# Patient Record
Sex: Female | Born: 1970
Health system: Southern US, Community
[De-identification: ages and names within clinical notes are randomized; demographics above are authoritative.]

## PROBLEM LIST (undated history)

## (undated) DIAGNOSIS — Z808 Family history of malignant neoplasm of other organs or systems: Secondary | ICD-10-CM

## (undated) DIAGNOSIS — Z800 Family history of malignant neoplasm of digestive organs: Secondary | ICD-10-CM

## (undated) DIAGNOSIS — R002 Palpitations: Secondary | ICD-10-CM

## (undated) DIAGNOSIS — E039 Hypothyroidism, unspecified: Secondary | ICD-10-CM

## (undated) DIAGNOSIS — Z789 Other specified health status: Secondary | ICD-10-CM

## (undated) DIAGNOSIS — D6959 Other secondary thrombocytopenia: Secondary | ICD-10-CM

## (undated) DIAGNOSIS — R87619 Unspecified abnormal cytological findings in specimens from cervix uteri: Secondary | ICD-10-CM

## (undated) DIAGNOSIS — IMO0002 Reserved for concepts with insufficient information to code with codable children: Secondary | ICD-10-CM

## (undated) DIAGNOSIS — Z803 Family history of malignant neoplasm of breast: Secondary | ICD-10-CM

## (undated) HISTORY — DX: Family history of malignant neoplasm of other organs or systems: Z80.8

## (undated) HISTORY — DX: Family history of malignant neoplasm of digestive organs: Z80.0

## (undated) HISTORY — PX: NO PAST SURGERIES: SHX2092

## (undated) HISTORY — DX: Hypothyroidism, unspecified: E03.9

## (undated) HISTORY — DX: Family history of malignant neoplasm of breast: Z80.3

## (undated) HISTORY — DX: Palpitations: R00.2

---

## 1997-07-14 ENCOUNTER — Other Ambulatory Visit: Admission: RE | Admit: 1997-07-14 | Discharge: 1997-07-14 | Payer: Self-pay | Admitting: Gynecology

## 1998-07-30 ENCOUNTER — Other Ambulatory Visit: Admission: RE | Admit: 1998-07-30 | Discharge: 1998-07-30 | Payer: Self-pay | Admitting: Obstetrics and Gynecology

## 1999-09-23 ENCOUNTER — Other Ambulatory Visit: Admission: RE | Admit: 1999-09-23 | Discharge: 1999-09-23 | Payer: Self-pay | Admitting: Obstetrics and Gynecology

## 2000-10-11 ENCOUNTER — Other Ambulatory Visit: Admission: RE | Admit: 2000-10-11 | Discharge: 2000-10-11 | Payer: Self-pay | Admitting: Gynecology

## 2002-04-04 ENCOUNTER — Other Ambulatory Visit: Admission: RE | Admit: 2002-04-04 | Discharge: 2002-04-04 | Payer: Self-pay | Admitting: *Deleted

## 2002-11-09 ENCOUNTER — Inpatient Hospital Stay (HOSPITAL_COMMUNITY): Admission: AD | Admit: 2002-11-09 | Discharge: 2002-11-12 | Payer: Self-pay | Admitting: *Deleted

## 2002-11-13 ENCOUNTER — Encounter: Admission: RE | Admit: 2002-11-13 | Discharge: 2002-12-13 | Payer: Self-pay | Admitting: *Deleted

## 2002-12-09 ENCOUNTER — Other Ambulatory Visit: Admission: RE | Admit: 2002-12-09 | Discharge: 2002-12-09 | Payer: Self-pay | Admitting: *Deleted

## 2003-01-13 ENCOUNTER — Encounter: Admission: RE | Admit: 2003-01-13 | Discharge: 2003-02-12 | Payer: Self-pay | Admitting: *Deleted

## 2004-02-19 ENCOUNTER — Inpatient Hospital Stay (HOSPITAL_COMMUNITY): Admission: AD | Admit: 2004-02-19 | Discharge: 2004-02-20 | Payer: Self-pay | Admitting: *Deleted

## 2004-02-19 ENCOUNTER — Inpatient Hospital Stay (HOSPITAL_COMMUNITY): Admission: AD | Admit: 2004-02-19 | Discharge: 2004-02-19 | Payer: Self-pay | Admitting: *Deleted

## 2004-02-21 ENCOUNTER — Encounter: Admission: RE | Admit: 2004-02-21 | Discharge: 2004-02-21 | Payer: Self-pay | Admitting: *Deleted

## 2004-03-23 ENCOUNTER — Encounter: Admission: RE | Admit: 2004-03-23 | Discharge: 2004-04-22 | Payer: Self-pay | Admitting: *Deleted

## 2004-04-23 ENCOUNTER — Encounter: Admission: RE | Admit: 2004-04-23 | Discharge: 2004-05-23 | Payer: Self-pay | Admitting: *Deleted

## 2005-04-07 ENCOUNTER — Encounter: Admission: RE | Admit: 2005-04-07 | Discharge: 2005-04-07 | Payer: Self-pay | Admitting: Cardiology

## 2006-04-18 ENCOUNTER — Encounter: Admission: RE | Admit: 2006-04-18 | Discharge: 2006-04-18 | Payer: Self-pay | Admitting: *Deleted

## 2006-05-04 ENCOUNTER — Encounter: Admission: RE | Admit: 2006-05-04 | Discharge: 2006-05-04 | Payer: Self-pay | Admitting: *Deleted

## 2006-05-16 ENCOUNTER — Encounter: Admission: RE | Admit: 2006-05-16 | Discharge: 2006-05-16 | Payer: Self-pay | Admitting: *Deleted

## 2006-06-01 ENCOUNTER — Encounter: Admission: RE | Admit: 2006-06-01 | Discharge: 2006-06-01 | Payer: Self-pay | Admitting: *Deleted

## 2006-06-01 ENCOUNTER — Encounter (INDEPENDENT_AMBULATORY_CARE_PROVIDER_SITE_OTHER): Payer: Self-pay | Admitting: Specialist

## 2007-01-07 ENCOUNTER — Encounter: Admission: RE | Admit: 2007-01-07 | Discharge: 2007-01-07 | Payer: Self-pay | Admitting: *Deleted

## 2007-05-08 ENCOUNTER — Encounter: Admission: RE | Admit: 2007-05-08 | Discharge: 2007-05-08 | Payer: Self-pay | Admitting: *Deleted

## 2008-05-12 ENCOUNTER — Encounter: Admission: RE | Admit: 2008-05-12 | Discharge: 2008-05-12 | Payer: Self-pay | Admitting: Obstetrics and Gynecology

## 2009-04-30 ENCOUNTER — Encounter: Admission: RE | Admit: 2009-04-30 | Discharge: 2009-04-30 | Payer: Self-pay | Admitting: Obstetrics and Gynecology

## 2010-02-27 NOTE — L&D Delivery Note (Signed)
Operative Delivery Note At 5:24 AM a viable and healthy female was delivered via .  Presentation: vertex; Position: Right,, Occiput,, Anterior; Station: +4. Fetal bradycardia x 10 minutes. Decision to proceed with operative delivery.  Verbal consent: obtained from family.  Risks and benefits discussed in detail.  Risks include, but are not limited to the risks of anesthesia, bleeding, infection, damage to maternal tissues, fetal cephalhematoma.  There is also the risk of inability to effect vaginal delivery of the head, or shoulder dystocia that cannot be resolved by established maneuvers, leading to the need for emergency cesarean section.   APGAR: , ; weight .   Placenta status: Intact, Spontaneous.   Cord:  with the following complications: .  Cord pH: pending  Anesthesia:  epidural Instruments: Kiwi cup x one pull Episiotomy: none Lacerations: 1st degree Suture Repair: 3.0 vicryl rapide Est. Blood Loss (mL):   Mom to postpartum.  Baby to nursery-stable.  Auston Halfmann J 01/13/2011, 5:37 AM

## 2010-03-30 ENCOUNTER — Other Ambulatory Visit: Payer: Self-pay | Admitting: Obstetrics and Gynecology

## 2010-03-30 DIAGNOSIS — Z1239 Encounter for other screening for malignant neoplasm of breast: Secondary | ICD-10-CM

## 2010-05-03 ENCOUNTER — Ambulatory Visit
Admission: RE | Admit: 2010-05-03 | Discharge: 2010-05-03 | Disposition: A | Discharge status: Home / Self Care | Payer: BLUE CROSS/BLUE SHIELD | Source: Ambulatory Visit | Attending: Obstetrics and Gynecology | Admitting: Obstetrics and Gynecology

## 2010-05-03 DIAGNOSIS — Z1239 Encounter for other screening for malignant neoplasm of breast: Secondary | ICD-10-CM

## 2010-06-16 LAB — HIV ANTIBODY (ROUTINE TESTING W REFLEX): HIV: NONREACTIVE

## 2010-06-16 LAB — HEPATITIS B SURFACE ANTIGEN: Hepatitis B Surface Ag: NEGATIVE

## 2010-06-16 LAB — RUBELLA ANTIBODY, IGM: Rubella: IMMUNE

## 2010-06-28 ENCOUNTER — Other Ambulatory Visit: Payer: Self-pay | Admitting: Obstetrics and Gynecology

## 2010-07-15 NOTE — H&P (Signed)
   Zuniga, Erica                            ACCOUNT NO.:  [id number]   MEDICAL RECORD NO.:  [id number]                   PATIENT TYPE:  MAT   LOCATION:  [facility]C                                 [facility]   PHYSICIAN:  [place] B. Earlene Plater, M.D.               DATE OF BIRTH:  08-Jul-1970   DATE OF ADMISSION:  DATE OF DISCHARGE:                                HISTORY & PHYSICAL   ADMISSION DIAGNOSES:  Post dates pregnancy.   HISTORY OF PRESENT ILLNESS:  A 40 year old female gravida 1, para 0, EDC of  November 04, 2002, admitted for induction of labor. Prenatal care [facility], Dr. Earlene Plater, uncomplicated. Rh negative. Did not receive RhoGAM, as  the father of the baby is noted to be Rh negative.   PAST MEDICAL HISTORY:  Migraines.   FAMILY HISTORY:  Diabetes.   PAST SURGICAL HISTORY:  Oral surgery.   REVIEW OF SYSTEMS:  Otherwise noncontributory.   MEDICATIONS:  Prenatal vitamins.   ALLERGIES:  None.   SOCIAL HISTORY:  No alcohol or tobacco or other drugs.   PRENATAL LABORATORY DATA:  O negative. Rubella immune. Hepatitis B, HIV, RPR  are all normal. Glucola normal. Group B strep negative. Triple screen  normal.   PHYSICAL EXAMINATION:  VITAL SIGNS: Blood pressure 120/70, weight 151  pounds. Fetal heart tones 150's.  GENERAL: Alert, oriented, and in no acute distress.  SKIN: Warm and dry. No lesions.  HEART: Regular rate and rhythm.  LUNGS: Clear to auscultation.  ABDOMEN: Liver and spleen normal. No hernia. Fundal height 36 cm and  estimated fetal weight on November 05, 2002 was 6 pounds 14 ounces (12th  percentile with normal AFI).  PELVIC: Cervix is 1 cm dilated, 90% effaced, 0 station, vertex.    ASSESSMENT:  A 38+ week intrauterine pregnancy, admitted for induction of  labor with small for gestational age fetus.   PLAN:  Admission with cervical ripening and Pitocin.                                               Gerri Spore B. Earlene Plater, M.D.    WBD/MEDQ  D:   11/09/2002  T:  11/09/2002  Job:  [id number]

## 2010-07-15 NOTE — Op Note (Signed)
[name], Erica Zuniga                          ACCOUNT NO.:  [id number]   MEDICAL RECORD NO.:  [id number]                   PATIENT TYPE:  INP   LOCATION:  9168                                 [facility]   PHYSICIAN:  [place] B. Earlene Plater, M.D.               DATE OF BIRTH:  Dec 21, 1970   DATE OF PROCEDURE:  11/10/2002  DATE OF DISCHARGE:                                 OPERATIVE REPORT   PREOPERATIVE DIAGNOSES:  1. Forty-plus week intrauterine pregnancy.  2. Severe repetitive variables.   POSTOPERATIVE DIAGNOSES:  1. Forty-plus week intrauterine pregnancy.  2. Severe repetitive variables.   PROCEDURE:  Vacuum-assisted vaginal delivery.   SURGEON:  Chester Holstein. Earlene Plater, M.D.   ANESTHESIA:  Epidural and 10 mL 1% lidocaine local in the perineum.   INDICATIONS:  The patient was being induced for post-dates pregnancy  initially with Cervidil and subsequently with Pitocin.  Made rapid progress  from 3 to complete over the span of about three to four hours.  Had been  pushing for about 45 minutes and was complete, complete, +3, direct OA  position, was noted to have severe repetitive variables with a delayed  return to baseline.  Given the nonreassuring fetal heart rate tracing, I  recommended intervention with operative patch delivery.  Risks discussed,  including minor clotting and bruising on the scalp as a primary concern and  rare more severe complications such as intracranial bleeding or  cephalohematoma.  Also discussed the potential for increased perineal trauma  for the mother.  All questions answered, patient in agreement and wished to  proceed.   DESCRIPTION OF PROCEDURE:  The patient was in the delivery room under  epidural anesthesia.  She did have some slight sensation to pinprick, and  therefore I infiltrated the perineum with 10 mL 1% lidocaine.  The bladder  had just been emptied with a red rubber catheter prior to vacuum placement.  The Kiwi device was placed on the  flexion point in the midline just anterior  to the posterior fontanelle.  Approximately three pulls were necessary.  There was one pop-off encountered.  Traction was in the mid-green zone  during pushing efforts only.  NICU was present for delivery.  The infant's  head was delivered was delivered without difficulty, the nose and mouth  suctioned with the bulb, and the remainder of the infant delivered without  difficulty.  There was no evidence of nuchal or body cord.  It was a viable  female, Apgars were 8 and 9.  No injuries noted to the baby.  The placenta  was expelled spontaneously.  It was noted to have a succenturiate lobe,  which had been noted on previous ultrasound.  A second degree perineal  laceration closed with standard technique with 2-0 and 3-0 chromic.  The patient tolerated the procedure well.  There were no complications.  She  and the newborn were in  the delivery room in stable and satisfactory  condition.     WBD/MEDQ  D:  11/10/2002  T:  11/11/2002  Job:  [id number]

## 2011-01-13 ENCOUNTER — Inpatient Hospital Stay (HOSPITAL_COMMUNITY)
Admission: AD | Admit: 2011-01-13 | Discharge: 2011-01-14 | DRG: 373 | Disposition: A | Discharge status: Home / Self Care | Payer: BC Managed Care – PPO | Source: Ambulatory Visit | Attending: Obstetrics and Gynecology | Admitting: Obstetrics and Gynecology

## 2011-01-13 ENCOUNTER — Encounter (HOSPITAL_COMMUNITY): Payer: Self-pay | Admitting: Anesthesiology

## 2011-01-13 ENCOUNTER — Inpatient Hospital Stay (HOSPITAL_COMMUNITY): Payer: BC Managed Care – PPO | Admitting: Anesthesiology

## 2011-01-13 ENCOUNTER — Encounter (HOSPITAL_COMMUNITY): Payer: Self-pay | Admitting: Obstetrics

## 2011-01-13 ENCOUNTER — Encounter (HOSPITAL_COMMUNITY): Payer: Self-pay | Admitting: *Deleted

## 2011-01-13 DIAGNOSIS — O09529 Supervision of elderly multigravida, unspecified trimester: Secondary | ICD-10-CM | POA: Diagnosis present

## 2011-01-13 DIAGNOSIS — D689 Coagulation defect, unspecified: Secondary | ICD-10-CM | POA: Diagnosis present

## 2011-01-13 DIAGNOSIS — D6959 Other secondary thrombocytopenia: Secondary | ICD-10-CM | POA: Diagnosis not present

## 2011-01-13 DIAGNOSIS — D696 Thrombocytopenia, unspecified: Secondary | ICD-10-CM | POA: Diagnosis present

## 2011-01-13 HISTORY — DX: Other specified health status: Z78.9

## 2011-01-13 HISTORY — DX: Unspecified abnormal cytological findings in specimens from cervix uteri: R87.619

## 2011-01-13 HISTORY — DX: Reserved for concepts with insufficient information to code with codable children: IMO0002

## 2011-01-13 HISTORY — DX: Other secondary thrombocytopenia: D69.59

## 2011-01-13 LAB — CBC
MCV: 91.6 fL (ref 78.0–100.0)
Platelets: 154 10*3/uL (ref 150–400)
RDW: 13.4 % (ref 11.5–15.5)
WBC: 9.2 10*3/uL (ref 4.0–10.5)

## 2011-01-13 MED ORDER — LIDOCAINE HCL 1.5 % IJ SOLN
INTRAMUSCULAR | Status: DC | PRN
  Administered 2011-01-13 (×2): 5 mL via EPIDURAL

## 2011-01-13 MED ORDER — FENTANYL 2.5 MCG/ML BUPIVACAINE 1/10 % EPIDURAL INFUSION (WH - ANES)
14.0000 mL/h | INTRAMUSCULAR | Status: DC
  Filled 2011-01-13: qty 60

## 2011-01-13 MED ORDER — DIPHENHYDRAMINE HCL 25 MG PO CAPS: 25.0000 mg | ORAL_CAPSULE | Freq: Four times a day (QID) | ORAL | Status: DC | PRN

## 2011-01-13 MED ORDER — METHYLERGONOVINE MALEATE 0.2 MG PO TABS: 0.2000 mg | ORAL_TABLET | ORAL | Status: DC | PRN

## 2011-01-13 MED ORDER — LIDOCAINE HCL (PF) 1 % IJ SOLN
30.0000 mL | INTRAMUSCULAR | Status: DC | PRN
  Administered 2011-01-13: 30 mL via SUBCUTANEOUS
  Filled 2011-01-13: qty 30

## 2011-01-13 MED ORDER — BENZOCAINE-MENTHOL 20-0.5 % EX AERO
1.0000 "application " | INHALATION_SPRAY | CUTANEOUS | Status: DC | PRN
  Administered 2011-01-14: 1 via TOPICAL

## 2011-01-13 MED ORDER — EPHEDRINE 5 MG/ML INJ
10.0000 mg | INTRAVENOUS | Status: DC | PRN
  Filled 2011-01-13: qty 4

## 2011-01-13 MED ORDER — IBUPROFEN 600 MG PO TABS
600.0000 mg | ORAL_TABLET | Freq: Four times a day (QID) | ORAL | Status: DC
  Administered 2011-01-13 – 2011-01-14 (×4): 600 mg via ORAL
  Filled 2011-01-13 (×3): qty 1

## 2011-01-13 MED ORDER — IBUPROFEN 600 MG PO TABS: 600.0000 mg | ORAL_TABLET | Freq: Four times a day (QID) | ORAL | Status: DC | PRN

## 2011-01-13 MED ORDER — OXYCODONE-ACETAMINOPHEN 5-325 MG PO TABS
1.0000 | ORAL_TABLET | ORAL | Status: DC | PRN
  Administered 2011-01-13 (×2): 1 via ORAL
  Filled 2011-01-13 (×2): qty 1

## 2011-01-13 MED ORDER — EPHEDRINE 5 MG/ML INJ: 10.0000 mg | INTRAVENOUS | Status: DC | PRN

## 2011-01-13 MED ORDER — ONDANSETRON HCL 4 MG PO TABS: 4.0000 mg | ORAL_TABLET | ORAL | Status: DC | PRN

## 2011-01-13 MED ORDER — FLEET ENEMA 7-19 GM/118ML RE ENEM: 1.0000 | ENEMA | RECTAL | Status: DC | PRN

## 2011-01-13 MED ORDER — LACTATED RINGERS IV SOLN
INTRAVENOUS | Status: DC
Start: 2011-01-13 — End: 2011-01-13
  Administered 2011-01-13: 03:00:00 via INTRAVENOUS

## 2011-01-13 MED ORDER — PRENATAL PLUS 27-1 MG PO TABS
1.0000 | ORAL_TABLET | Freq: Every day | ORAL | Status: DC
  Administered 2011-01-14: 1 via ORAL
  Filled 2011-01-13: qty 1

## 2011-01-13 MED ORDER — METHYLERGONOVINE MALEATE 0.2 MG/ML IJ SOLN: 0.2000 mg | INTRAMUSCULAR | Status: DC | PRN

## 2011-01-13 MED ORDER — OXYCODONE-ACETAMINOPHEN 5-325 MG PO TABS: 2.0000 | ORAL_TABLET | ORAL | Status: DC | PRN

## 2011-01-13 MED ORDER — DIPHENHYDRAMINE HCL 50 MG/ML IJ SOLN: 12.5000 mg | INTRAMUSCULAR | Status: DC | PRN

## 2011-01-13 MED ORDER — CITRIC ACID-SODIUM CITRATE 334-500 MG/5ML PO SOLN: 30.0000 mL | ORAL | Status: DC | PRN

## 2011-01-13 MED ORDER — LACTATED RINGERS IV SOLN
500.0000 mL | Freq: Once | INTRAVENOUS | Status: AC
  Administered 2011-01-13: 1000 mL via INTRAVENOUS

## 2011-01-13 MED ORDER — BENZOCAINE-MENTHOL 20-0.5 % EX AERO
INHALATION_SPRAY | CUTANEOUS | Status: AC
  Administered 2011-01-14: 1 via TOPICAL
  Filled 2011-01-13: qty 56

## 2011-01-13 MED ORDER — DIBUCAINE 1 % RE OINT: 1.0000 "application " | TOPICAL_OINTMENT | RECTAL | Status: DC | PRN

## 2011-01-13 MED ORDER — FENTANYL 2.5 MCG/ML BUPIVACAINE 1/10 % EPIDURAL INFUSION (WH - ANES)
INTRAMUSCULAR | Status: DC | PRN
  Administered 2011-01-13: 14 mL/h via EPIDURAL

## 2011-01-13 MED ORDER — ACETAMINOPHEN 325 MG PO TABS: 650.0000 mg | ORAL_TABLET | ORAL | Status: DC | PRN

## 2011-01-13 MED ORDER — PHENYLEPHRINE 40 MCG/ML (10ML) SYRINGE FOR IV PUSH (FOR BLOOD PRESSURE SUPPORT)
80.0000 ug | PREFILLED_SYRINGE | INTRAVENOUS | Status: DC | PRN
  Filled 2011-01-13: qty 5

## 2011-01-13 MED ORDER — ONDANSETRON HCL 4 MG/2ML IJ SOLN: 4.0000 mg | Freq: Four times a day (QID) | INTRAMUSCULAR | Status: DC | PRN

## 2011-01-13 MED ORDER — TETANUS-DIPHTH-ACELL PERTUSSIS 5-2.5-18.5 LF-MCG/0.5 IM SUSP
0.5000 mL | Freq: Once | INTRAMUSCULAR | Status: AC
  Administered 2011-01-14: 0.5 mL via INTRAMUSCULAR
  Filled 2011-01-13: qty 0.5

## 2011-01-13 MED ORDER — LANOLIN HYDROUS EX OINT: TOPICAL_OINTMENT | CUTANEOUS | Status: DC | PRN

## 2011-01-13 MED ORDER — SIMETHICONE 80 MG PO CHEW: 80.0000 mg | CHEWABLE_TABLET | ORAL | Status: DC | PRN

## 2011-01-13 MED ORDER — OXYTOCIN BOLUS FROM INFUSION
500.0000 mL | Freq: Once | INTRAVENOUS | Status: DC
  Filled 2011-01-13: qty 1000
  Filled 2011-01-13: qty 500

## 2011-01-13 MED ORDER — LACTATED RINGERS IV SOLN: 500.0000 mL | INTRAVENOUS | Status: DC | PRN

## 2011-01-13 MED ORDER — ONDANSETRON HCL 4 MG/2ML IJ SOLN: 4.0000 mg | INTRAMUSCULAR | Status: DC | PRN

## 2011-01-13 MED ORDER — OXYTOCIN 20 UNITS IN LACTATED RINGERS INFUSION - SIMPLE
125.0000 mL/h | Freq: Once | INTRAVENOUS | Status: AC
  Administered 2011-01-13: 999 mL/h via INTRAVENOUS

## 2011-01-13 MED ORDER — SENNOSIDES-DOCUSATE SODIUM 8.6-50 MG PO TABS
2.0000 | ORAL_TABLET | Freq: Every day | ORAL | Status: DC
  Administered 2011-01-13: 2 via ORAL

## 2011-01-13 MED ORDER — OXYTOCIN 20 UNITS IN LACTATED RINGERS INFUSION - SIMPLE: 125.0000 mL/h | INTRAVENOUS | Status: DC

## 2011-01-13 MED ORDER — ZOLPIDEM TARTRATE 5 MG PO TABS: 5.0000 mg | ORAL_TABLET | Freq: Every evening | ORAL | Status: DC | PRN

## 2011-01-13 MED ORDER — PHENYLEPHRINE 40 MCG/ML (10ML) SYRINGE FOR IV PUSH (FOR BLOOD PRESSURE SUPPORT): 80.0000 ug | PREFILLED_SYRINGE | INTRAVENOUS | Status: DC | PRN

## 2011-01-13 MED ORDER — WITCH HAZEL-GLYCERIN EX PADS: 1.0000 "application " | MEDICATED_PAD | CUTANEOUS | Status: DC | PRN

## 2011-01-13 NOTE — Anesthesia Procedure Notes (Signed)
Epidural Patient location during procedure: OB Start time: 01/13/2011 4:55 AM End time: 01/13/2011 5:01 AM Reason for block: procedure for pain  Staffing Anesthesiologist: Sandrea Hughs Performed by: anesthesiologist   Preanesthetic Checklist Completed: patient identified, site marked, surgical consent, pre-op evaluation, timeout performed, IV checked, risks and benefits discussed and monitors and equipment checked  Epidural Patient position: sitting Prep: site prepped and draped and DuraPrep Patient monitoring: continuous pulse ox and blood pressure Approach: midline Injection technique: LOR air  Needle:  Needle type: Tuohy  Needle gauge: 17 G Needle length: 9 cm Needle insertion depth: 6 cm Catheter type: closed end flexible Catheter size: 19 Gauge Catheter at skin depth: 11 cm Test dose: negative and 1.5% lidocaine  Assessment Sensory level: T10 Events: blood not aspirated, injection not painful, no injection resistance, negative IV test and paresthesia  Additional Notes R x 1

## 2011-01-13 NOTE — Addendum Note (Signed)
Addendum  created 01/13/11 1641 by Seairra Otani   Modules edited:Charges VN, Notes Section    

## 2011-01-13 NOTE — Anesthesia Preprocedure Evaluation (Signed)

## 2011-01-13 NOTE — Anesthesia Postprocedure Evaluation (Signed)
Anesthesia Post Note  Patient: Erica Zuniga  Procedure(s) Performed: * No procedures listed *  Anesthesia type: Epidural  Patient location: Mother/Baby  Post pain: Pain level controlled  Post assessment: Post-op Vital signs reviewed  Last Vitals:  Filed Vitals:   01/13/11 0700  BP: 106/59  Pulse: 68  Temp:   Resp: 18    Post vital signs: Reviewed  Level of consciousness: awake  Complications: No apparent anesthesia complications

## 2011-01-13 NOTE — Consult Note (Addendum)
Neonatology Note:  Attendance at Delivery:  I was asked to attend this vacuum-assisted vaginal delivery at 38 1/[redacted] weeks GA due to prolonged fetal bradycardia following placement of epidural anesthesia. The mother is a G3P2 O neg, GBS neg. ROM just before delivery, fluid clear. Infant with good spontaneous cry but poor tone at birth, responded well to minimal stimulation. Our team arrived at about 2-3 minutes of age, at which time the baby was crying well, very pink in room air with excellent perfusion, an O2 saturation of 83-85% (normal for age), but no muscle tone. Needed only minimal bulb suctioning. I observed the baby until 10 minutes of life, by which time her tone was improving, with occasional active movements, although not completely normalized yet. Ap 5/7/8. Lungs clear to ausc in DR with a suggestion of grunting noted. I spoke with her parents and felt she could remain in the birthing suite with observation by the OB nurse, with instructions to take her to the CN if any resp distress was noted. Transferred to care of Dr. O'Kelley. Cord pH 6.93 with a base deficit of 19.5. Erica Toomey, MD  

## 2011-01-13 NOTE — Progress Notes (Signed)
Pt G3 P2 at 38wks having contractions every .  Denies bleeding or leaking fluid.  No problems with pregnancy.  11/14 SVE 5cm.

## 2011-01-13 NOTE — Addendum Note (Signed)
Addendum  created 01/13/11 1641 by Cephus Shelling   Modules edited:Charges VN, Notes Section

## 2011-01-13 NOTE — H&P (Signed)
Erica Zuniga, Erica Zuniga                ACCOUNT NO.:  [id number]  MEDICAL RECORD NO.:  [id number]  LOCATION:  9168                          [facility]ILITY:  [facility]  PHYSICIAN:  Lenoard Aden, M.D.DATE OF BIRTH:  [birth year]  DATE OF ADMISSION:  01/13/2011 DATE OF DISCHARGE:                             HISTORY & PHYSICAL   CHIEF COMPLAINT:  Labor.  HISTORY OF PRESENT ILLNESS:  She is a 40 year old white female, G3, P2, at 38-1/7th weeks' gestation who presents in active labor.  She has no known drug allergies.  MEDICATIONS:  Prenatal vitamins.  She is a nonsmoker, nondrinker.  Denies domestic or physical violence.  She has a family history of breast cancer, diabetes, and skin cancer.  She has a history of 2 previous vaginal deliveries.  PAST SURGICAL HISTORY:  Cryotherapy.  PHYSICAL EXAMINATION:  GENERAL:  She is a well-developed, well- nourished, white female, in moderate amount of distress. HEENT:  Normal. NECK:  Supple.  Full range of motion. LUNGS:  Clear. HEART:  Regular rate and rhythm. ABDOMEN:  Soft, gravid, nontender.  Estimated fetal weight 6-1/2 to 7 pounds.  Cervix is 10 cm, 100% vertex, +3. EXTREMITIES:  No cords. NEUROLOGIC:  Nonfocal. SKIN:  Intact.  IMPRESSION:  Term intrauterine pregnancy in active labor.  PLAN:  Anticipate attempts at vaginal delivery.     Lenoard Aden, M.D.     RJT/MEDQ  D:  01/13/2011  T:  01/13/2011  Job:  [id number]

## 2011-01-13 NOTE — Anesthesia Postprocedure Evaluation (Signed)
Anesthesia Post Note  Patient: Erica Zuniga  Procedure(s) Performed: * No procedures listed *  Anesthesia type: Epidural  Patient location: Mother/Baby  Post pain: Pain level controlled  Post assessment: Post-op Vital signs reviewed  Last Vitals:  Filed Vitals:   01/13/11 1503  BP: 101/64  Pulse: 76  Temp: 37 C  Resp: 17    Post vital signs: Reviewed  Level of consciousness:alert  Complications: No apparent anesthesia complications

## 2011-01-14 ENCOUNTER — Encounter (HOSPITAL_COMMUNITY): Payer: Self-pay

## 2011-01-14 DIAGNOSIS — D6959 Other secondary thrombocytopenia: Secondary | ICD-10-CM

## 2011-01-14 HISTORY — DX: Other secondary thrombocytopenia: D69.59

## 2011-01-14 LAB — CBC
HCT: 32.5 % — ABNORMAL LOW (ref 36.0–46.0)
Hemoglobin: 10.9 g/dL — ABNORMAL LOW (ref 12.0–15.0)
MCV: 93.9 fL (ref 78.0–100.0)
RBC: 3.46 MIL/uL — ABNORMAL LOW (ref 3.87–5.11)
RDW: 13.7 % (ref 11.5–15.5)
WBC: 8.7 10*3/uL (ref 4.0–10.5)

## 2011-01-14 MED ORDER — IBUPROFEN 600 MG PO TABS: 600.0000 mg | ORAL_TABLET | Freq: Four times a day (QID) | ORAL | Status: AC | PRN

## 2011-01-14 MED ORDER — BENZOCAINE-MENTHOL 20-0.5 % EX AERO
INHALATION_SPRAY | CUTANEOUS | Status: AC
  Filled 2011-01-14: qty 56

## 2011-01-14 NOTE — Progress Notes (Signed)
PPD 1 VAVD  S:  Reports feeling well, desires early dc this am             Tolerating po/ No nausea or vomiting             Bleeding is light             Pain controlled withibuprofen (OTC)             Up ad lib / ambulatory  Newborn  Information for the patient's newborn:  Shanikka, Wonders [[id number]]  female   breast feeding well   O:  A & O x 3, NAD             VS: Blood pressure 109/73, pulse 76, temperature 98.2 F (36.8 C), temperature source Oral, resp. rate 18, height 5\' 4"  (1.626 m), weight 64.32 kg (141 lb 12.8 oz), last menstrual period 04/16/2010, SpO2 100.00%, unknown if currently breastfeeding.  LABS:   Basename 01/14/11 0600 01/13/11 0300  HGB 10.9* 11.7*  HCT 32.5* 34.0*    I&O:        Lungs: Clear and unlabored  Heart: regular rate and rhythm / no mumurs  Abdomen: soft, non-tender, non-distended              Fundus: firm, non-tender, U-1  Perineum: healing  Lochia: scant rubra  Extremities: no edema    A/P: PPD # 1 40 y.o., G3P1003 S/P:vacuum extraction   Active Problems:  Thrombocytopenia due to blood loss Rh negative status-fetal blood type pending RhoGham PRN  Doing well - stable status Routine post partum orders DC home   Juanetta Beets, SNM Arlan Organ, CNM, MSN 01/14/2011, 9:35 AM

## 2011-01-14 NOTE — Discharge Summary (Signed)
Obstetric Discharge Summary Reason for Admission: onset of labor Prenatal Procedures: ultrasound Intrapartum Procedures: vacuum (fetal intolerance) Postpartum Procedures: Tdap Complications-Operative and Postpartum: 1st degree perineal laceration Hemoglobin  Date Value Range Status  01/14/2011 10.9* 12.0-15.0 (g/dL) Final     HCT  Date Value Range Status  01/14/2011 32.5* 36.0-46.0 (%) Final    Discharge Diagnoses: Term Pregnancy-delivered; thrombocytopenia Rh negative status- newborn with Rh negative blood type.  Discharge Information: Date: 01/14/2011 Activity: pelvic rest Diet: routine Medications: PNV, Ibuprofen and tylenol Condition: stable Instructions: refer to practice specific booklet Discharge to: home Follow-up Information    Follow up with Lenoard Aden, MD. Make an appointment in 6 weeks.   Contact information:   [address] [place] [address] [phone]          Newborn Data: Live born female  Birth Weight: 6 lb 5 oz (2863 g) APGAR: 5, 7  Home with mother.  Erica Zuniga, SNM Eliasar Hlavaty 01/14/2011, 9:55 AM

## 2013-03-31 ENCOUNTER — Other Ambulatory Visit: Payer: Self-pay

## 2013-03-31 DIAGNOSIS — Z1231 Encounter for screening mammogram for malignant neoplasm of breast: Secondary | ICD-10-CM

## 2013-04-21 ENCOUNTER — Ambulatory Visit: Payer: BC Managed Care – PPO

## 2013-05-06 ENCOUNTER — Ambulatory Visit
Admission: RE | Admit: 2013-05-06 | Discharge: 2013-05-06 | Disposition: A | Discharge status: Home / Self Care | Payer: BC Managed Care – PPO | Source: Ambulatory Visit

## 2013-05-06 DIAGNOSIS — Z1231 Encounter for screening mammogram for malignant neoplasm of breast: Secondary | ICD-10-CM

## 2013-12-29 ENCOUNTER — Encounter (HOSPITAL_COMMUNITY): Payer: Self-pay

## 2014-06-16 ENCOUNTER — Other Ambulatory Visit: Payer: Self-pay

## 2014-06-16 DIAGNOSIS — Z1231 Encounter for screening mammogram for malignant neoplasm of breast: Secondary | ICD-10-CM

## 2014-06-18 ENCOUNTER — Ambulatory Visit: Payer: Self-pay

## 2015-06-25 ENCOUNTER — Other Ambulatory Visit: Payer: Self-pay | Admitting: Obstetrics and Gynecology

## 2015-06-25 DIAGNOSIS — R928 Other abnormal and inconclusive findings on diagnostic imaging of breast: Secondary | ICD-10-CM

## 2015-06-30 ENCOUNTER — Ambulatory Visit
Admission: RE | Admit: 2015-06-30 | Discharge: 2015-06-30 | Disposition: A | Discharge status: Home / Self Care | Payer: BLUE CROSS/BLUE SHIELD | Source: Ambulatory Visit | Attending: Obstetrics and Gynecology | Admitting: Obstetrics and Gynecology

## 2015-06-30 DIAGNOSIS — N6489 Other specified disorders of breast: Secondary | ICD-10-CM | POA: Diagnosis not present

## 2015-06-30 DIAGNOSIS — R928 Other abnormal and inconclusive findings on diagnostic imaging of breast: Secondary | ICD-10-CM

## 2015-07-19 ENCOUNTER — Telehealth: Payer: Self-pay | Admitting: Genetic Counselor

## 2015-07-19 ENCOUNTER — Encounter: Payer: Self-pay | Admitting: Genetic Counselor

## 2015-07-19 NOTE — Telephone Encounter (Signed)
Verified address and insurance, mailed new pt packet, pt confirmed appt.

## 2015-07-20 ENCOUNTER — Other Ambulatory Visit: Payer: Self-pay | Admitting: General Surgery

## 2015-07-20 DIAGNOSIS — Z1239 Encounter for other screening for malignant neoplasm of breast: Secondary | ICD-10-CM | POA: Diagnosis not present

## 2015-07-21 ENCOUNTER — Other Ambulatory Visit: Payer: Self-pay | Admitting: General Surgery

## 2015-07-21 DIAGNOSIS — Z803 Family history of malignant neoplasm of breast: Secondary | ICD-10-CM

## 2015-08-20 ENCOUNTER — Ambulatory Visit
Admission: RE | Admit: 2015-08-20 | Discharge: 2015-08-20 | Disposition: A | Discharge status: Home / Self Care | Payer: BLUE CROSS/BLUE SHIELD | Source: Ambulatory Visit | Attending: General Surgery | Admitting: General Surgery

## 2015-08-20 DIAGNOSIS — N6489 Other specified disorders of breast: Secondary | ICD-10-CM | POA: Diagnosis not present

## 2015-08-20 DIAGNOSIS — Z803 Family history of malignant neoplasm of breast: Secondary | ICD-10-CM

## 2015-08-20 MED ORDER — GADOBENATE DIMEGLUMINE 529 MG/ML IV SOLN
11.0000 mL | Freq: Once | INTRAVENOUS | Status: AC | PRN
  Administered 2015-08-20: 11 mL via INTRAVENOUS

## 2015-08-30 ENCOUNTER — Other Ambulatory Visit: Payer: BLUE CROSS/BLUE SHIELD

## 2015-08-30 ENCOUNTER — Ambulatory Visit (HOSPITAL_BASED_OUTPATIENT_CLINIC_OR_DEPARTMENT_OTHER): Payer: BLUE CROSS/BLUE SHIELD | Admitting: Genetic Counselor

## 2015-08-30 ENCOUNTER — Encounter: Payer: Self-pay | Admitting: Genetic Counselor

## 2015-08-30 DIAGNOSIS — Z803 Family history of malignant neoplasm of breast: Secondary | ICD-10-CM | POA: Diagnosis not present

## 2015-08-30 DIAGNOSIS — Z808 Family history of malignant neoplasm of other organs or systems: Secondary | ICD-10-CM | POA: Diagnosis not present

## 2015-08-30 DIAGNOSIS — Z800 Family history of malignant neoplasm of digestive organs: Secondary | ICD-10-CM | POA: Diagnosis not present

## 2015-08-30 DIAGNOSIS — Z315 Encounter for genetic counseling: Secondary | ICD-10-CM | POA: Diagnosis not present

## 2015-08-30 NOTE — Progress Notes (Signed)
REFERRING PROVIDER: Rolm Bookbinder, MD  PRIMARY PROVIDER:  No primary care provider on file.  PRIMARY REASON FOR VISIT:  1. Family history of breast cancer   2. Family history of brain cancer   3. Family history of stomach cancer      HISTORY OF PRESENT ILLNESS:   [name], a 45 y.o. female, was seen for a [facility] cancer genetics consultation at the request of Dr. Donne Hazel due to a family history of cancer.  Erica Zuniga presents to clinic today to discuss the possibility of a hereditary predisposition to cancer, genetic testing, and to further clarify her future cancer risks, as well as potential cancer risks for family members. Erica Zuniga is a 45 y.o. female with no personal history of cancer.  Erica Zuniga was tested for BRCA mutation through Dr. Marvel Plan and was found to have a BRCA2 VUS.  This was found through [facility] in 2015.  Per [facility], this variant is classified as benign or likely benign through other laboratories.  Erica Zuniga had a mammogram that found a mass that is thought to be benign.  She was seen by Dr. Rolm Bookbinder, who referred her to genetics.  CANCER HISTORY:   No history exists.     HORMONAL RISK FACTORS:  Menarche was at age 38.  First live birth at age 46.  OCP use for approximately 20 years.  Ovaries intact: yes.  Hysterectomy: no.  Menopausal status: premenopausal.  HRT use: 0 years. Colonoscopy: no; not examined. Mammogram within the last year: yes. Number of breast biopsies: 1. Up to date with pelvic exams:  yes. Any excessive radiation exposure in the past:  no  Past Medical History  Diagnosis Date  . No pertinent past medical history   . Abnormal Pap smear 45 yo  . Postpartum care following vaginal delivery (01/13/11) 01/13/2011  . Thrombocytopenia due to blood loss 01/14/2011  . Vacuum extraction, delivered, current hospitalization (11/16) 01/13/2011  . Family history of breast cancer   . Family history of brain cancer   . Family  history of stomach cancer     Past Surgical History  Procedure Laterality Date  . No past surgeries      Social History   Social History  . Marital Status: Married    Spouse Name: Herbie Baltimore  . Number of Children: 3  . Years of Education: N/A   Social History Main Topics  . Smoking status: Never Smoker   . Smokeless tobacco: None  . Alcohol Use: Yes     Comment: occasional  . Drug Use: No  . Sexual Activity: Yes   Other Topics Concern  . None   Social History Narrative     FAMILY HISTORY:  We obtained a detailed, 4-generation family history.  Significant diagnoses are listed below: Family History  Problem Relation Age of Onset  . Breast cancer Mother 67  . Brain cancer Father 46  . Stomach cancer Maternal Uncle 25  . Cancer Paternal Uncle 3    tumors in his heart  . Breast cancer Maternal Grandmother 60  . Breast cancer Brother 62  . Breast cancer Maternal Aunt 60  . Breast cancer Maternal Aunt 70    The patient has three daughters who are healthy and cancer free.  She has two maternal half siblings, a sister and brother, and a full brother.  All are cancer free.  Her mother was diagnosed with breast cancer at age 58 and died at 29.  Her mother had four  sisters and three brothers.  Three sisters were diagnosed with breast cancer, one at age 68.  One brother died of stomach cancer at age 55.  The patient's maternal grandmother was diagnosed with breast cancer at age 32 and died at 53.  The patient's father was diagnosed with a brain tumor at 59 and died at 65.  He had two brothers and two sisters.  One brother had tumors in his heart and died by age 47.  Patient's maternal ancestors are of [place] descent, and paternal ancestors are of Caucasian descent. There is no reported Ashkenazi Jewish ancestry. There is no known consanguinity.  GENETIC COUNSELING ASSESSMENT: Erica Zuniga is a 45 y.o. female with a family history of breast, stomach and brain cancer which is somewhat  suggestive of a hereditary cancer syndrome and predisposition to cancer. We, therefore, discussed and recommended the following at today's visit.   DISCUSSION: We discussed that about 5-10% of breast cancer is due to hereditary mutations, most commonly BRCA mutations.  Based on Ms. Waggoner previous testing, she most likely will not have a BRCA mutation.  HOwever, other genes are associated with hereditary breast cancer, most commonly in our clinic we see PALB2, ATM and CHEK2 mutations.  Brain tumors can be associated with hereditary mutations as well.  Some early onset brain tumors are seen with TP53 mutations, but these are usually associated with young breast cancers, with about 50% of patients with TP53 mutations having cancer by age 58-50. We briefly discussed other genes that could be associated with brain and heart tumors, including TSC1 and TSC2, and less likely PARKAR1A mutations. We reviewed the characteristics, features and inheritance patterns of hereditary cancer syndromes. We also discussed genetic testing, including the appropriate family members to test, the process of testing, insurance coverage and turn-around-time for results. We discussed the implications of a negative, positive and/or variant of uncertain significant result. We recommended Ms. Bratton pursue genetic testing for the Hereditary common gene panel and the hereditary brain tumor panel through [facility].  The Hereditary Gene Panel offered by [facility] includes sequencing and/or deletion duplication testing of the following 42 genes: APC, ATM, AXIN2, BARD1, BMPR1A, BRCA1, BRCA2, BRIP1, CDH1, CDKN2A, CHEK2, DICER1, EPCAM, GREM1, KIT, MEN1, MLH1, MSH2, MSH6, MUTYH, NBN, NF1, PALB2, PDGFRA, PMS2, POLD1, POLE, PTEN, RAD50, RAD51C, RAD51D, SDHA, SDHB, SDHC, SDHD, SMAD4, SMARCA4. STK11, TP53, TSC1, TSC2, and VHL.  The CNS/Brain Cancer Panel offered by [facility] includes sequencing and/or deletion duplication testing of the following 42 genes: ALK,  APC, BAP1, BARD1, CDK4, CDKN2A, DICER1, EPCAM, EZH2, GPC3, HRAS, KIF1B, MEN1, MLH1, MSH2, MSH6, NF1, NF2, PHOX2B, PMS2, PO1, PRKAR1A, PTCH2, PTEN, RB1, SMARCA4, SMARCB1, SMARCE1, SUFU, TP53, TSC1, TSC2, and VHL.     Based on Ms. Hasting's family history of cancer, she meets medical criteria for genetic testing. Despite that she meets criteria, she may still have an out of pocket cost. We discussed that if her out of pocket cost for testing is over $100, the laboratory will call and confirm whether she wants to proceed with testing.  If the out of pocket cost of testing is less than $100 she will be billed by the genetic testing laboratory.   Based on the patient's personal and family history, statistical models (Tyrer Cusik)  and literature data were used to estimate her risk of developing breast cancer. These estimate her lifetime risk of developing breast cancer to be approximately 31.7%. This estimation takes into account Ms. Rigg's previously negative genetic testing results.  The  patient's lifetime breast cancer risk is a preliminary estimate based on available information using one of several models endorsed by the [employer] (ACS). The ACS recommends consideration of breast MRI screening as an adjunct to mammography for patients at high risk (defined as 20% or greater lifetime risk). A more detailed breast cancer risk assessment can be considered, if clinically indicated.   PLAN: After considering the risks, benefits, and limitations, Ms. Bisch  provided informed consent to pursue genetic testing and the blood sample was sent to [facility] for analysis of the hereditary common cancer and brain tumor panels. Results should be available within approximately 2-3 weeks' time, at which point they will be disclosed by telephone to Ms. Mclester, as will any additional recommendations warranted by these results. Ms. Petraglia will receive a summary of her genetic counseling visit and a copy of her  results once available. This information will also be available in Epic. We encouraged Ms. Atlas to remain in contact with cancer genetics annually so that we can continuously update the family history and inform her of any changes in cancer genetics and testing that may be of benefit for her family. Ms. Smyser questions were answered to her satisfaction today. Our contact information was provided should additional questions or concerns arise.  Lastly, we encouraged Ms. Ericsson to remain in contact with cancer genetics annually so that we can continuously update the family history and inform her of any changes in cancer genetics and testing that may be of benefit for this family.   Ms.  Dols questions were answered to her satisfaction today. Our contact information was provided should additional questions or concerns arise. Thank you for the referral and allowing [place] to share in the care of your patient.   Karen P. Florene Glen, [place], [facility] Certified Genetic Counselor [email or web address] phone: [phone]  The patient was seen for a total of 60 minutes in face-to-face genetic counseling.  This patient was discussed with Drs. Magrinat, Lindi Adie and/or Burr Medico who agrees with the above.    _______________________________________________________________________ For Office Staff:  Number of people involved in session: 1 Was an Intern/ student involved with case: no

## 2015-09-21 ENCOUNTER — Encounter: Payer: Self-pay | Admitting: Genetic Counselor

## 2015-09-30 ENCOUNTER — Telehealth: Payer: Self-pay | Admitting: Genetic Counselor

## 2015-09-30 ENCOUNTER — Ambulatory Visit: Payer: Self-pay | Admitting: Genetic Counselor

## 2015-09-30 ENCOUNTER — Encounter: Payer: Self-pay | Admitting: Genetic Counselor

## 2015-09-30 DIAGNOSIS — Z1379 Encounter for other screening for genetic and chromosomal anomalies: Secondary | ICD-10-CM | POA: Insufficient documentation

## 2015-09-30 DIAGNOSIS — Z803 Family history of malignant neoplasm of breast: Secondary | ICD-10-CM

## 2015-09-30 DIAGNOSIS — Z808 Family history of malignant neoplasm of other organs or systems: Secondary | ICD-10-CM

## 2015-09-30 DIAGNOSIS — Z800 Family history of malignant neoplasm of digestive organs: Secondary | ICD-10-CM

## 2015-09-30 NOTE — Telephone Encounter (Signed)
LM on VM that results are back.  Left CB instructions.

## 2015-09-30 NOTE — Telephone Encounter (Signed)
Revealed that her genetic testing was negative on a 42 gene panel.  Discussed that based on her family history her lifetime risk for breast cancer is still high.  Based on a [employer], her lifetime risk for breast cancer based on family history and negative genetic testing is above 24%.  Therefore, MRI would be recommended based on ACS guidelines.  Discussed that the pattern of cancer in the family could indicate that there is a cancer syndrome that she did not inherit, and testing other family members would be helpful.  It could also mean that there is either a familial pattern that places her at increased risk but there is not a single gene implicated.  There could also be a gene that we have not discovered yet that in the future could be identified.  Asked that she keep in contact with genetics.  Patient voiced understanding.  Released results to patient and referring MD Emelia Loron.

## 2015-09-30 NOTE — Progress Notes (Signed)
HPI: Ms. Hammontree was previously seen in the [place] clinic due to a family history of cancer and concerns regarding a hereditary predisposition to cancer. Please refer to our prior cancer genetics clinic note for more information regarding Ms. Heckman's medical, social and family histories, and our assessment and recommendations, at the time. Ms. Throckmorton recent genetic test results were disclosed to her, as were recommendations warranted by these results. These results and recommendations are discussed in more detail below.  FAMILY HISTORY:  We obtained a detailed, 4-generation family history.  Significant diagnoses are listed below: Family History  Problem Relation Age of Onset  . Breast cancer Mother 54  . Brain cancer Father 85  . Breast cancer Maternal Aunt 41  . Stomach cancer Maternal Uncle 25  . Cancer Paternal Uncle 3    tumors in his heart  . Breast cancer Maternal Grandmother 60  . Breast cancer Maternal Aunt 60  . Breast cancer Maternal Aunt 70    The patient has three daughters who are healthy and cancer free.  She has two maternal half siblings, a sister and brother, and a full brother.  All are cancer free.  Her mother was diagnosed with breast cancer at age 14 and died at 66.  Her mother had four sisters and three brothers.  Three sisters were diagnosed with breast cancer, one at age 18.  One brother died of stomach cancer at age 85.  The patient's maternal grandmother was diagnosed with breast cancer at age 86 and died at 74.  The patient's father was diagnosed with a brain tumor at 56 and died at 19.  He had two brothers and two sisters.  One brother had tumors in his heart and died by age 44.  Patient's maternal ancestors are of [place] descent, and paternal ancestors are of Caucasian descent. There is no reported Ashkenazi Jewish ancestry. There is no known consanguinity.  GENETIC TEST RESULTS: At the time of Ms. Gayton's visit, we recommended she pursue genetic  testing of the Hereditary Common Cancer gene panel. The Hereditary Gene Panel offered by [facility] includes sequencing and/or deletion duplication testing of the following 42 genes: APC, ATM, AXIN2, BARD1, BMPR1A, BRCA1, BRCA2, BRIP1, CDH1, CDKN2A, CHEK2, DICER1, EPCAM, GREM1, KIT, MEN1, MLH1, MSH2, MSH6, MUTYH, NBN, NF1, PALB2, PDGFRA, PMS2, POLD1, POLE, PTEN, RAD50, RAD51C, RAD51D, SDHA, SDHB, SDHC, SDHD, SMAD4, SMARCA4. STK11, TP53, TSC1, TSC2, and VHL.    The report date is September 29, 2015.  Genetic testing was normal, and did not reveal a deleterious mutation in these genes. The test report has been scanned into EPIC and is located under the Molecular Pathology section of the Results Review tab.   We discussed with Ms. Osburn that since the current genetic testing is not perfect, it is possible there may be a gene mutation in one of these genes that current testing cannot detect, but that chance is small. We also discussed, that it is possible that another gene that has not yet been discovered, or that we have not yet tested, is responsible for the cancer diagnoses in the family, and it is, therefore, important to remain in touch with cancer genetics in the future so that we can continue to offer Ms. Tieszen the most up to date genetic testing.   CANCER SCREENING RECOMMENDATIONS: Given Ms. Tellado's personal and family histories, we must interpret these negative results with some caution.  Families with features suggestive of hereditary risk for cancer tend to have multiple family  members with cancer, diagnoses in multiple generations and diagnoses before the age of 16. Ms. Gunnels family exhibits some of these features. Thus this result may simply reflect our current inability to detect all mutations within these genes or there may be a different gene that has not yet been discovered or tested.   Based on the Ms. Levett's personal and family history of cancer, as well as her genetic test results, statistical  models (Tyrer Cusik)  and literature data were used to estimate her risk of developing breast cancer. These estimate her lifetime risk of developing breast to be approximately 24.3%.  The patient's lifetime breast cancer risk is a preliminary estimate based on available information using one of several models endorsed by the [place] (ACS). The ACS recommends consideration of breast MRI screening as an adjunct to mammography for patients at high risk (defined as 20% or greater lifetime risk). A more detailed breast cancer risk assessment can be considered, if clinically indicated.   Ms. Hilario has been determined to be at high risk for breast cancer.  Therefore, we recommend that annual screening with mammography and breast MRI begin at age 69, or 10 years prior to the age of breast cancer diagnosis in a relative (whichever is earlier).  We discussed that Ms. Kimberlin should discuss her individual situation with her referring physician and determine a breast cancer screening plan with which they are both comfortable.    RECOMMENDATIONS FOR FAMILY MEMBERS: Women in this family might be at some increased risk of developing cancer, over the general population risk, simply due to the family history of cancer. We recommended women in this family have a yearly mammogram beginning at age 23, or 69 years younger than the earliest onset of cancer, an an annual clinical breast exam, and perform monthly breast self-exams. Women in this family should also have a gynecological exam as recommended by their primary provider. All family members should have a colonoscopy by age 72.  FOLLOW-UP: Lastly, we discussed with Ms. Roads that cancer genetics is a rapidly advancing field and it is possible that new genetic tests will be appropriate for her and/or her family members in the future. We encouraged her to remain in contact with cancer genetics on an annual basis so we can update her personal and family histories  and let her know of advances in cancer genetics that may benefit this family.   Our contact number was provided. Ms. Sabey questions were answered to her satisfaction, and she knows she is welcome to call us at anytime with additional questions or concerns.   Roma Kayser, MS, [facility] Certified Genetic Counselor [email or web address]

## 2016-01-07 DIAGNOSIS — H5213 Myopia, bilateral: Secondary | ICD-10-CM | POA: Diagnosis not present

## 2016-04-11 ENCOUNTER — Encounter (HOSPITAL_COMMUNITY): Payer: Self-pay

## 2016-05-30 DIAGNOSIS — F3281 Premenstrual dysphoric disorder: Secondary | ICD-10-CM | POA: Diagnosis not present

## 2016-06-21 DIAGNOSIS — N943 Premenstrual tension syndrome: Secondary | ICD-10-CM | POA: Diagnosis not present

## 2016-06-30 DIAGNOSIS — N943 Premenstrual tension syndrome: Secondary | ICD-10-CM | POA: Diagnosis not present

## 2016-07-04 DIAGNOSIS — N943 Premenstrual tension syndrome: Secondary | ICD-10-CM | POA: Diagnosis not present

## 2016-07-05 DIAGNOSIS — N943 Premenstrual tension syndrome: Secondary | ICD-10-CM | POA: Diagnosis not present

## 2016-07-10 DIAGNOSIS — Z1231 Encounter for screening mammogram for malignant neoplasm of breast: Secondary | ICD-10-CM | POA: Diagnosis not present

## 2016-07-10 DIAGNOSIS — Z01419 Encounter for gynecological examination (general) (routine) without abnormal findings: Secondary | ICD-10-CM | POA: Diagnosis not present

## 2016-07-10 DIAGNOSIS — Z793 Long term (current) use of hormonal contraceptives: Secondary | ICD-10-CM | POA: Diagnosis not present

## 2016-07-10 DIAGNOSIS — Z6822 Body mass index (BMI) 22.0-22.9, adult: Secondary | ICD-10-CM | POA: Diagnosis not present

## 2016-07-10 DIAGNOSIS — F3281 Premenstrual dysphoric disorder: Secondary | ICD-10-CM | POA: Diagnosis not present

## 2016-07-10 DIAGNOSIS — Z1389 Encounter for screening for other disorder: Secondary | ICD-10-CM | POA: Diagnosis not present

## 2016-07-10 DIAGNOSIS — R319 Hematuria, unspecified: Secondary | ICD-10-CM | POA: Diagnosis not present

## 2016-07-10 DIAGNOSIS — Z130 Encounter for screening for diseases of the blood and blood-forming organs and certain disorders involving the immune mechanism: Secondary | ICD-10-CM | POA: Diagnosis not present

## 2016-07-10 DIAGNOSIS — Z30432 Encounter for removal of intrauterine contraceptive device: Secondary | ICD-10-CM | POA: Diagnosis not present

## 2016-07-11 DIAGNOSIS — N943 Premenstrual tension syndrome: Secondary | ICD-10-CM | POA: Diagnosis not present

## 2016-07-13 DIAGNOSIS — N943 Premenstrual tension syndrome: Secondary | ICD-10-CM | POA: Diagnosis not present

## 2016-07-20 DIAGNOSIS — N943 Premenstrual tension syndrome: Secondary | ICD-10-CM | POA: Diagnosis not present

## 2016-07-31 DIAGNOSIS — N943 Premenstrual tension syndrome: Secondary | ICD-10-CM | POA: Diagnosis not present

## 2016-08-02 DIAGNOSIS — N943 Premenstrual tension syndrome: Secondary | ICD-10-CM | POA: Diagnosis not present

## 2016-08-24 DIAGNOSIS — N943 Premenstrual tension syndrome: Secondary | ICD-10-CM | POA: Diagnosis not present

## 2016-08-28 DIAGNOSIS — N943 Premenstrual tension syndrome: Secondary | ICD-10-CM | POA: Diagnosis not present

## 2016-09-05 DIAGNOSIS — Z136 Encounter for screening for cardiovascular disorders: Secondary | ICD-10-CM | POA: Diagnosis not present

## 2016-09-05 DIAGNOSIS — F439 Reaction to severe stress, unspecified: Secondary | ICD-10-CM | POA: Diagnosis not present

## 2016-09-05 DIAGNOSIS — Z630 Problems in relationship with spouse or partner: Secondary | ICD-10-CM | POA: Diagnosis not present

## 2016-09-05 DIAGNOSIS — R454 Irritability and anger: Secondary | ICD-10-CM | POA: Diagnosis not present

## 2016-09-05 DIAGNOSIS — Z803 Family history of malignant neoplasm of breast: Secondary | ICD-10-CM | POA: Diagnosis not present

## 2016-09-05 DIAGNOSIS — R3121 Asymptomatic microscopic hematuria: Secondary | ICD-10-CM | POA: Diagnosis not present

## 2016-09-07 DIAGNOSIS — F439 Reaction to severe stress, unspecified: Secondary | ICD-10-CM | POA: Diagnosis not present

## 2016-09-14 DIAGNOSIS — F439 Reaction to severe stress, unspecified: Secondary | ICD-10-CM | POA: Diagnosis not present

## 2016-09-20 DIAGNOSIS — F439 Reaction to severe stress, unspecified: Secondary | ICD-10-CM | POA: Diagnosis not present

## 2016-10-03 DIAGNOSIS — F439 Reaction to severe stress, unspecified: Secondary | ICD-10-CM | POA: Diagnosis not present

## 2016-10-03 DIAGNOSIS — Z803 Family history of malignant neoplasm of breast: Secondary | ICD-10-CM | POA: Diagnosis not present

## 2016-10-03 DIAGNOSIS — R454 Irritability and anger: Secondary | ICD-10-CM | POA: Diagnosis not present

## 2016-10-03 DIAGNOSIS — R87610 Atypical squamous cells of undetermined significance on cytologic smear of cervix (ASC-US): Secondary | ICD-10-CM | POA: Diagnosis not present

## 2016-10-18 DIAGNOSIS — F439 Reaction to severe stress, unspecified: Secondary | ICD-10-CM | POA: Diagnosis not present

## 2016-10-25 DIAGNOSIS — F439 Reaction to severe stress, unspecified: Secondary | ICD-10-CM | POA: Diagnosis not present

## 2016-11-01 DIAGNOSIS — F439 Reaction to severe stress, unspecified: Secondary | ICD-10-CM | POA: Diagnosis not present

## 2016-11-07 DIAGNOSIS — F439 Reaction to severe stress, unspecified: Secondary | ICD-10-CM | POA: Diagnosis not present

## 2016-11-22 DIAGNOSIS — F439 Reaction to severe stress, unspecified: Secondary | ICD-10-CM | POA: Diagnosis not present

## 2016-11-22 DIAGNOSIS — F3281 Premenstrual dysphoric disorder: Secondary | ICD-10-CM | POA: Diagnosis not present

## 2016-11-29 DIAGNOSIS — F3281 Premenstrual dysphoric disorder: Secondary | ICD-10-CM | POA: Diagnosis not present

## 2016-11-29 DIAGNOSIS — F439 Reaction to severe stress, unspecified: Secondary | ICD-10-CM | POA: Diagnosis not present

## 2016-12-05 DIAGNOSIS — H5213 Myopia, bilateral: Secondary | ICD-10-CM | POA: Diagnosis not present

## 2016-12-05 DIAGNOSIS — H04123 Dry eye syndrome of bilateral lacrimal glands: Secondary | ICD-10-CM | POA: Diagnosis not present

## 2016-12-05 DIAGNOSIS — H5231 Anisometropia: Secondary | ICD-10-CM | POA: Diagnosis not present

## 2016-12-06 DIAGNOSIS — F3281 Premenstrual dysphoric disorder: Secondary | ICD-10-CM | POA: Diagnosis not present

## 2016-12-06 DIAGNOSIS — F439 Reaction to severe stress, unspecified: Secondary | ICD-10-CM | POA: Diagnosis not present

## 2016-12-20 DIAGNOSIS — F3281 Premenstrual dysphoric disorder: Secondary | ICD-10-CM | POA: Diagnosis not present

## 2016-12-20 DIAGNOSIS — F439 Reaction to severe stress, unspecified: Secondary | ICD-10-CM | POA: Diagnosis not present

## 2016-12-27 DIAGNOSIS — F439 Reaction to severe stress, unspecified: Secondary | ICD-10-CM | POA: Diagnosis not present

## 2016-12-27 DIAGNOSIS — F3281 Premenstrual dysphoric disorder: Secondary | ICD-10-CM | POA: Diagnosis not present

## 2017-01-03 DIAGNOSIS — R5383 Other fatigue: Secondary | ICD-10-CM | POA: Diagnosis not present

## 2017-01-03 DIAGNOSIS — E559 Vitamin D deficiency, unspecified: Secondary | ICD-10-CM | POA: Diagnosis not present

## 2017-01-03 DIAGNOSIS — F419 Anxiety disorder, unspecified: Secondary | ICD-10-CM | POA: Diagnosis not present

## 2017-01-03 DIAGNOSIS — F3281 Premenstrual dysphoric disorder: Secondary | ICD-10-CM | POA: Diagnosis not present

## 2017-01-03 DIAGNOSIS — R454 Irritability and anger: Secondary | ICD-10-CM | POA: Diagnosis not present

## 2017-01-10 DIAGNOSIS — F439 Reaction to severe stress, unspecified: Secondary | ICD-10-CM | POA: Diagnosis not present

## 2017-01-10 DIAGNOSIS — F3281 Premenstrual dysphoric disorder: Secondary | ICD-10-CM | POA: Diagnosis not present

## 2017-01-23 DIAGNOSIS — R319 Hematuria, unspecified: Secondary | ICD-10-CM | POA: Diagnosis not present

## 2017-01-23 DIAGNOSIS — F3281 Premenstrual dysphoric disorder: Secondary | ICD-10-CM | POA: Diagnosis not present

## 2017-01-23 DIAGNOSIS — R454 Irritability and anger: Secondary | ICD-10-CM | POA: Diagnosis not present

## 2017-01-23 DIAGNOSIS — R5383 Other fatigue: Secondary | ICD-10-CM | POA: Diagnosis not present

## 2017-01-23 DIAGNOSIS — F419 Anxiety disorder, unspecified: Secondary | ICD-10-CM | POA: Diagnosis not present

## 2017-01-24 DIAGNOSIS — F439 Reaction to severe stress, unspecified: Secondary | ICD-10-CM | POA: Diagnosis not present

## 2017-01-24 DIAGNOSIS — F3281 Premenstrual dysphoric disorder: Secondary | ICD-10-CM | POA: Diagnosis not present

## 2017-01-31 DIAGNOSIS — F3281 Premenstrual dysphoric disorder: Secondary | ICD-10-CM | POA: Diagnosis not present

## 2017-01-31 DIAGNOSIS — F439 Reaction to severe stress, unspecified: Secondary | ICD-10-CM | POA: Diagnosis not present

## 2017-02-09 DIAGNOSIS — F3281 Premenstrual dysphoric disorder: Secondary | ICD-10-CM | POA: Diagnosis not present

## 2017-02-09 DIAGNOSIS — F439 Reaction to severe stress, unspecified: Secondary | ICD-10-CM | POA: Diagnosis not present

## 2017-02-13 DIAGNOSIS — E039 Hypothyroidism, unspecified: Secondary | ICD-10-CM | POA: Diagnosis not present

## 2017-02-13 DIAGNOSIS — F3281 Premenstrual dysphoric disorder: Secondary | ICD-10-CM | POA: Diagnosis not present

## 2017-02-14 DIAGNOSIS — F3281 Premenstrual dysphoric disorder: Secondary | ICD-10-CM | POA: Diagnosis not present

## 2017-02-14 DIAGNOSIS — F439 Reaction to severe stress, unspecified: Secondary | ICD-10-CM | POA: Diagnosis not present

## 2017-03-07 DIAGNOSIS — F3281 Premenstrual dysphoric disorder: Secondary | ICD-10-CM | POA: Diagnosis not present

## 2017-03-07 DIAGNOSIS — F439 Reaction to severe stress, unspecified: Secondary | ICD-10-CM | POA: Diagnosis not present

## 2017-03-14 DIAGNOSIS — F439 Reaction to severe stress, unspecified: Secondary | ICD-10-CM | POA: Diagnosis not present

## 2017-03-14 DIAGNOSIS — F3281 Premenstrual dysphoric disorder: Secondary | ICD-10-CM | POA: Diagnosis not present

## 2017-03-20 DIAGNOSIS — D2262 Melanocytic nevi of left upper limb, including shoulder: Secondary | ICD-10-CM | POA: Diagnosis not present

## 2017-03-20 DIAGNOSIS — D2261 Melanocytic nevi of right upper limb, including shoulder: Secondary | ICD-10-CM | POA: Diagnosis not present

## 2017-03-20 DIAGNOSIS — D2271 Melanocytic nevi of right lower limb, including hip: Secondary | ICD-10-CM | POA: Diagnosis not present

## 2017-03-20 DIAGNOSIS — D2239 Melanocytic nevi of other parts of face: Secondary | ICD-10-CM | POA: Diagnosis not present

## 2017-03-28 DIAGNOSIS — F3281 Premenstrual dysphoric disorder: Secondary | ICD-10-CM | POA: Diagnosis not present

## 2017-03-28 DIAGNOSIS — F439 Reaction to severe stress, unspecified: Secondary | ICD-10-CM | POA: Diagnosis not present

## 2017-04-11 DIAGNOSIS — F439 Reaction to severe stress, unspecified: Secondary | ICD-10-CM | POA: Diagnosis not present

## 2017-04-11 DIAGNOSIS — F3281 Premenstrual dysphoric disorder: Secondary | ICD-10-CM | POA: Diagnosis not present

## 2017-04-16 DIAGNOSIS — R5383 Other fatigue: Secondary | ICD-10-CM | POA: Diagnosis not present

## 2017-04-16 DIAGNOSIS — F419 Anxiety disorder, unspecified: Secondary | ICD-10-CM | POA: Diagnosis not present

## 2017-04-16 DIAGNOSIS — E559 Vitamin D deficiency, unspecified: Secondary | ICD-10-CM | POA: Diagnosis not present

## 2017-04-16 DIAGNOSIS — F3281 Premenstrual dysphoric disorder: Secondary | ICD-10-CM | POA: Diagnosis not present

## 2017-04-16 DIAGNOSIS — R454 Irritability and anger: Secondary | ICD-10-CM | POA: Diagnosis not present

## 2017-04-19 DIAGNOSIS — Z008 Encounter for other general examination: Secondary | ICD-10-CM | POA: Diagnosis not present

## 2017-04-23 DIAGNOSIS — F419 Anxiety disorder, unspecified: Secondary | ICD-10-CM | POA: Diagnosis not present

## 2017-04-23 DIAGNOSIS — F3281 Premenstrual dysphoric disorder: Secondary | ICD-10-CM | POA: Diagnosis not present

## 2017-04-23 DIAGNOSIS — R5383 Other fatigue: Secondary | ICD-10-CM | POA: Diagnosis not present

## 2017-04-23 DIAGNOSIS — R454 Irritability and anger: Secondary | ICD-10-CM | POA: Diagnosis not present

## 2017-04-23 DIAGNOSIS — E039 Hypothyroidism, unspecified: Secondary | ICD-10-CM | POA: Diagnosis not present

## 2017-04-25 DIAGNOSIS — F439 Reaction to severe stress, unspecified: Secondary | ICD-10-CM | POA: Diagnosis not present

## 2017-04-25 DIAGNOSIS — F3281 Premenstrual dysphoric disorder: Secondary | ICD-10-CM | POA: Diagnosis not present

## 2017-05-02 DIAGNOSIS — F439 Reaction to severe stress, unspecified: Secondary | ICD-10-CM | POA: Diagnosis not present

## 2017-05-02 DIAGNOSIS — F3281 Premenstrual dysphoric disorder: Secondary | ICD-10-CM | POA: Diagnosis not present

## 2017-05-04 DIAGNOSIS — M542 Cervicalgia: Secondary | ICD-10-CM | POA: Diagnosis not present

## 2017-05-04 DIAGNOSIS — M9903 Segmental and somatic dysfunction of lumbar region: Secondary | ICD-10-CM | POA: Diagnosis not present

## 2017-05-04 DIAGNOSIS — M25521 Pain in right elbow: Secondary | ICD-10-CM | POA: Diagnosis not present

## 2017-05-04 DIAGNOSIS — M9901 Segmental and somatic dysfunction of cervical region: Secondary | ICD-10-CM | POA: Diagnosis not present

## 2017-05-09 DIAGNOSIS — F439 Reaction to severe stress, unspecified: Secondary | ICD-10-CM | POA: Diagnosis not present

## 2017-05-09 DIAGNOSIS — F3281 Premenstrual dysphoric disorder: Secondary | ICD-10-CM | POA: Diagnosis not present

## 2017-05-17 DIAGNOSIS — Z803 Family history of malignant neoplasm of breast: Secondary | ICD-10-CM | POA: Diagnosis not present

## 2017-05-17 DIAGNOSIS — Z87448 Personal history of other diseases of urinary system: Secondary | ICD-10-CM | POA: Diagnosis not present

## 2017-05-17 DIAGNOSIS — Z0000 Encounter for general adult medical examination without abnormal findings: Secondary | ICD-10-CM | POA: Diagnosis not present

## 2017-05-17 DIAGNOSIS — R002 Palpitations: Secondary | ICD-10-CM | POA: Diagnosis not present

## 2017-05-18 ENCOUNTER — Telehealth: Payer: Self-pay

## 2017-05-18 NOTE — Telephone Encounter (Signed)
SENT REFERRAL TO SCHEDULING FROM DR DEBORAH SCHOENHOFF PH# [phone]  

## 2017-05-24 DIAGNOSIS — F439 Reaction to severe stress, unspecified: Secondary | ICD-10-CM | POA: Diagnosis not present

## 2017-05-24 DIAGNOSIS — F3281 Premenstrual dysphoric disorder: Secondary | ICD-10-CM | POA: Diagnosis not present

## 2017-05-30 DIAGNOSIS — F439 Reaction to severe stress, unspecified: Secondary | ICD-10-CM | POA: Diagnosis not present

## 2017-05-30 DIAGNOSIS — F3281 Premenstrual dysphoric disorder: Secondary | ICD-10-CM | POA: Diagnosis not present

## 2017-06-06 DIAGNOSIS — Z87448 Personal history of other diseases of urinary system: Secondary | ICD-10-CM | POA: Diagnosis not present

## 2017-06-06 DIAGNOSIS — F439 Reaction to severe stress, unspecified: Secondary | ICD-10-CM | POA: Diagnosis not present

## 2017-06-06 DIAGNOSIS — Z803 Family history of malignant neoplasm of breast: Secondary | ICD-10-CM | POA: Diagnosis not present

## 2017-06-06 DIAGNOSIS — F3281 Premenstrual dysphoric disorder: Secondary | ICD-10-CM | POA: Diagnosis not present

## 2017-06-13 DIAGNOSIS — F439 Reaction to severe stress, unspecified: Secondary | ICD-10-CM | POA: Diagnosis not present

## 2017-06-13 DIAGNOSIS — F3281 Premenstrual dysphoric disorder: Secondary | ICD-10-CM | POA: Diagnosis not present

## 2017-06-20 DIAGNOSIS — F439 Reaction to severe stress, unspecified: Secondary | ICD-10-CM | POA: Diagnosis not present

## 2017-06-20 DIAGNOSIS — F3281 Premenstrual dysphoric disorder: Secondary | ICD-10-CM | POA: Diagnosis not present

## 2017-07-04 DIAGNOSIS — F439 Reaction to severe stress, unspecified: Secondary | ICD-10-CM | POA: Diagnosis not present

## 2017-07-04 DIAGNOSIS — F3281 Premenstrual dysphoric disorder: Secondary | ICD-10-CM | POA: Diagnosis not present

## 2017-07-11 ENCOUNTER — Encounter: Payer: Self-pay | Admitting: Cardiovascular Disease

## 2017-07-11 ENCOUNTER — Telehealth: Payer: Self-pay | Admitting: *Deleted

## 2017-07-11 DIAGNOSIS — F3281 Premenstrual dysphoric disorder: Secondary | ICD-10-CM | POA: Diagnosis not present

## 2017-07-11 DIAGNOSIS — F439 Reaction to severe stress, unspecified: Secondary | ICD-10-CM | POA: Diagnosis not present

## 2017-07-11 NOTE — Telephone Encounter (Signed)
FAXED NOTES TO [facility] FROM DR. [facility] SCHOENHOFF AT [phone].

## 2017-07-13 ENCOUNTER — Encounter: Payer: Self-pay | Admitting: Cardiovascular Disease

## 2017-07-13 ENCOUNTER — Ambulatory Visit (INDEPENDENT_AMBULATORY_CARE_PROVIDER_SITE_OTHER): Payer: BLUE CROSS/BLUE SHIELD | Admitting: Cardiovascular Disease

## 2017-07-13 VITALS — BP 110/71 | HR 76 | Ht 64.0 in | Wt 131.8 lb

## 2017-07-13 DIAGNOSIS — R002 Palpitations: Secondary | ICD-10-CM | POA: Diagnosis not present

## 2017-07-13 DIAGNOSIS — E039 Hypothyroidism, unspecified: Secondary | ICD-10-CM

## 2017-07-13 DIAGNOSIS — Z8639 Personal history of other endocrine, nutritional and metabolic disease: Secondary | ICD-10-CM | POA: Diagnosis not present

## 2017-07-13 DIAGNOSIS — R000 Tachycardia, unspecified: Secondary | ICD-10-CM

## 2017-07-13 DIAGNOSIS — Z5181 Encounter for therapeutic drug level monitoring: Secondary | ICD-10-CM

## 2017-07-13 HISTORY — DX: Hypothyroidism, unspecified: E03.9

## 2017-07-13 HISTORY — DX: Palpitations: R00.2

## 2017-07-13 NOTE — Progress Notes (Signed)
Cardiology Office Note   Date:  07/13/2017   ID:  Zuniga Erica, DOB [birth year], MRN [id number]  PCP:  Kendrick Ranch, MD  Cardiologist:   Chilton Si, MD   Chief Complaint  Patient presents with  . Follow-up  . Chest Pain    pressure, tightness, fluttering.  . Leg Pain    cramping in legs at night.       History of Present Illness: Erica Zuniga is a 47 y.o. female with hypothyroidism who is being seen today for the evaluation of chest tightness and palpitations at the request of Schoenhoff, Harrington Challenger, *.  [name] has had intermittent episodes of palpitations for the last 12 years.  It started after the birth of her second child.  At that time she saw a cardiologist and reports that she was told she had a "arrhythmia" but did not require any treatment.  Over time it had improved.  Starting in January she started to have more frequent episodes of chest tightness and heart racing.  It typically occurs when sitting at her desk.  She does not feel particularly stressed at the time.  She has an ECG monitor on her watch and brings recordings that show sinus tachycardia from the 100s to 110s.  The episodes make her have to take deep breaths but she does not feel particularly short of breath.  There is no associated lightheadedness or dizziness.  Last from 5 to 20 minutes at a time.  In the last 2 days she had 2 episodes.  The last episode prior to that was 2 weeks ago.  Around the same time when the symptoms started her Armour Thyroid was increased from 30-60.  She has not experienced any lower extremity edema, orthopnea, or PND.  She has no exertional symptoms.  She does not get much formal exercise but she does walk her dog daily for 1 mile.  This takes approximately 20 minutes.  She had one episode of shortness of breath that occurred while carrying her 40-year-old on her back but no other exertional chest pain or shortness of breath.  She has no caffeine intake and rarely uses  over-the-counter Sudafed.  She reports that she has not been feeling stressed lately.   Past Medical History:  Diagnosis Date  . Abnormal Pap smear 47 yo  . Family history of brain cancer   . Family history of breast cancer   . Family history of stomach cancer   . Hypothyroidism 07/13/2017  . No pertinent past medical history   . Palpitations 07/13/2017  . Postpartum care following vaginal delivery (01/13/11) 01/13/2011  . Thrombocytopenia due to blood loss 01/14/2011  . Vacuum extraction, delivered, current hospitalization (11/16) 01/13/2011    Past Surgical History:  Procedure Laterality Date  . NO PAST SURGERIES       Current Outpatient Medications  Medication Sig Dispense Refill  . ARMOUR THYROID 60 MG tablet   3  . escitalopram (LEXAPRO) 10 MG tablet escitalopram 10 mg tablet  TAKE ONE TABLET BY MOUTH DAILY    . [name] 3-0.02 MG tablet   4   No current facility-administered medications for this visit.     Allergies:   Patient has no known allergies.    Social History:  The patient  reports that she has never smoked. She has never used smokeless tobacco. She reports that she drinks alcohol. She reports that she does not use drugs.   Family History:  The patient's family  history includes Brain cancer (age of onset: 73) in her father; Breast cancer (age of onset: 19) in her maternal aunt; Breast cancer (age of onset: 3) in her maternal aunt, maternal grandmother, and mother; Breast cancer (age of onset: 49) in her maternal aunt; Cancer (age of onset: 3) in her paternal uncle; Stomach cancer (age of onset: 15) in her maternal uncle.    ROS:  Please see the history of present illness.   Otherwise, review of systems are positive for none.   All other systems are reviewed and negative.    PHYSICAL EXAM: VS:  BP 110/71   Pulse 76   Ht  (1.626 m)   Wt 131 lb 12.8 oz (59.8 kg)   BMI 22.62 kg/m  , BMI Body mass index is 22.62 kg/m. GENERAL:  Well appearing HEENT:   Pupils equal round and reactive, fundi not visualized, oral mucosa unremarkable NECK:  No jugular venous distention, waveform within normal limits, carotid upstroke brisk and symmetric, no bruits, no thyromegaly LYMPHATICS:  No cervical adenopathy LUNGS:  Clear to auscultation bilaterally HEART:  RRR.  PMI not displaced or sustained,S1 and S2 within normal limits, no S3, no S4, no clicks, no rubs, no murmurs ABD:  Flat, positive bowel sounds normal in frequency in pitch, no bruits, no rebound, no guarding, no midline pulsatile mass, no hepatomegaly, no splenomegaly EXT:  2 plus pulses throughout, no edema, no cyanosis no clubbing SKIN:  No rashes no nodules NEURO:  Cranial nerves II through XII grossly intact, motor grossly intact throughout PSYCH:  Cognitively intact, oriented to person place and time   EKG:  EKG is ordered today. The ekg ordered today demonstrates sinus rhythm.  Rate 76 bpm.   Recent Labs: No results found for requested labs within last 8760 hours.   09/05/2016: Total cholesterol 186, triglycerides 176, HDL 65, LDL 86 TSH 1.9 Hemoglobin 16.1 Potassium 3.8, creatinine 0.6  Lipid Panel No results found for: CHOL, TRIG, HDL, CHOLHDL, VLDL, LDLCALC, LDLDIRECT    Wt Readings from Last 3 Encounters:  07/13/17 131 lb 12.8 oz (59.8 kg)  01/13/11 141 lb 12.8 oz (64.3 kg)      ASSESSMENT AND PLAN:  # Palpitations: ECG tracing showed sinus tachycardia from the 100s to the 110s.  These were taken at the time of her symptoms so ambulatory event monitoring is unnecessary at this time.  I suspect that it may be due to the change in her thyroid medication.  We will check TSH, free T4, CBC, CMP, and magnesium today.   Current medicines are reviewed at length with the patient today.  The patient does not have concerns regarding medicines.  The following changes have been made:  no change  Labs/ tests ordered today include:   Orders Placed This Encounter  Procedures  .  Comprehensive metabolic panel  . T4, free  . TSH  . CBC with Differential/Platelet  . Magnesium  . Basic metabolic panel     Disposition:   FU with APP in 1 month.   Zannah Melucci C. Duke Salvia, MD, [facility] n 3 months.     Signed, Addie Cederberg C. Duke Salvia, MD, [facility]  07/13/2017 1:05 PM    [facility]

## 2017-07-13 NOTE — Patient Instructions (Addendum)
Medication Instructions:  Your physician recommends that you continue on your current medications as directed. Please refer to the Current Medication list given to you today.  Labwork: BMET/CBC/TSH/FT4/MAGNESIUM TODAY   Testing/Procedures: NONE  Follow-Up: Your physician recommends that you schedule a follow-up appointment in: 1 MONTH WITH NP/PA  If you need a refill on your cardiac medications before your next appointment, please call your pharmacy.

## 2017-07-14 LAB — BASIC METABOLIC PANEL
BUN/Creatinine Ratio: 22 (ref 9–23)
BUN: 15 mg/dL (ref 6–24)
CALCIUM: 8.8 mg/dL (ref 8.7–10.2)
CHLORIDE: 100 mmol/L (ref 96–106)
CO2: 23 mmol/L (ref 20–29)
CREATININE: 0.69 mg/dL (ref 0.57–1.00)
GFR calc Af Amer: 122 mL/min/{1.73_m2} (ref >59)
GFR calc non Af Amer: 106 mL/min/{1.73_m2} (ref >59)
GLUCOSE: 85 mg/dL (ref 65–99)
Potassium: 4.8 mmol/L (ref 3.5–5.2)
Sodium: 135 mmol/L (ref 134–144)

## 2017-07-14 LAB — CBC WITH DIFFERENTIAL/PLATELET
BASOS ABS: 0 10*3/uL (ref 0.0–0.2)
Basos: 0 %
EOS (ABSOLUTE): 0.1 10*3/uL (ref 0.0–0.4)
EOS: 2 %
HEMATOCRIT: 35.2 % (ref 34.0–46.6)
HEMOGLOBIN: 11.8 g/dL (ref 11.1–15.9)
IMMATURE GRANS (ABS): 0 10*3/uL (ref 0.0–0.1)
Immature Granulocytes: 0 %
LYMPHS ABS: 1.9 10*3/uL (ref 0.7–3.1)
LYMPHS: 41 %
MCH: 32 pg (ref 26.6–33.0)
MCHC: 33.5 g/dL (ref 31.5–35.7)
MCV: 95 fL (ref 79–97)
MONOCYTES: 7 %
Monocytes Absolute: 0.3 10*3/uL (ref 0.1–0.9)
NEUTROS ABS: 2.3 10*3/uL (ref 1.4–7.0)
Neutrophils: 50 %
Platelets: 208 10*3/uL (ref 150–379)
RBC: 3.69 x10E6/uL — ABNORMAL LOW (ref 3.77–5.28)
RDW: 12 % — ABNORMAL LOW (ref 12.3–15.4)
WBC: 4.7 10*3/uL (ref 3.4–10.8)

## 2017-07-14 LAB — TSH: TSH: 0.662 u[IU]/mL (ref 0.450–4.500)

## 2017-07-14 LAB — MAGNESIUM: Magnesium: 1.8 mg/dL (ref 1.6–2.3)

## 2017-07-14 LAB — T4, FREE: Free T4: 0.85 ng/dL (ref 0.82–1.77)

## 2017-07-16 DIAGNOSIS — Z1231 Encounter for screening mammogram for malignant neoplasm of breast: Secondary | ICD-10-CM | POA: Diagnosis not present

## 2017-07-18 DIAGNOSIS — F439 Reaction to severe stress, unspecified: Secondary | ICD-10-CM | POA: Diagnosis not present

## 2017-07-18 DIAGNOSIS — F3281 Premenstrual dysphoric disorder: Secondary | ICD-10-CM | POA: Diagnosis not present

## 2017-07-25 DIAGNOSIS — F3281 Premenstrual dysphoric disorder: Secondary | ICD-10-CM | POA: Diagnosis not present

## 2017-07-25 DIAGNOSIS — F439 Reaction to severe stress, unspecified: Secondary | ICD-10-CM | POA: Diagnosis not present

## 2017-08-01 DIAGNOSIS — F439 Reaction to severe stress, unspecified: Secondary | ICD-10-CM | POA: Diagnosis not present

## 2017-08-01 DIAGNOSIS — F3281 Premenstrual dysphoric disorder: Secondary | ICD-10-CM | POA: Diagnosis not present

## 2017-08-03 DIAGNOSIS — Z1389 Encounter for screening for other disorder: Secondary | ICD-10-CM | POA: Diagnosis not present

## 2017-08-03 DIAGNOSIS — Z130 Encounter for screening for diseases of the blood and blood-forming organs and certain disorders involving the immune mechanism: Secondary | ICD-10-CM | POA: Diagnosis not present

## 2017-08-03 DIAGNOSIS — Z124 Encounter for screening for malignant neoplasm of cervix: Secondary | ICD-10-CM | POA: Diagnosis not present

## 2017-08-03 DIAGNOSIS — Z6822 Body mass index (BMI) 22.0-22.9, adult: Secondary | ICD-10-CM | POA: Diagnosis not present

## 2017-08-03 DIAGNOSIS — Z01419 Encounter for gynecological examination (general) (routine) without abnormal findings: Secondary | ICD-10-CM | POA: Diagnosis not present

## 2017-08-08 DIAGNOSIS — F3281 Premenstrual dysphoric disorder: Secondary | ICD-10-CM | POA: Diagnosis not present

## 2017-08-08 DIAGNOSIS — F439 Reaction to severe stress, unspecified: Secondary | ICD-10-CM | POA: Diagnosis not present

## 2017-08-15 DIAGNOSIS — F439 Reaction to severe stress, unspecified: Secondary | ICD-10-CM | POA: Diagnosis not present

## 2017-08-15 DIAGNOSIS — F3281 Premenstrual dysphoric disorder: Secondary | ICD-10-CM | POA: Diagnosis not present

## 2017-08-17 ENCOUNTER — Encounter: Payer: Self-pay | Admitting: Cardiology

## 2017-08-17 ENCOUNTER — Ambulatory Visit (INDEPENDENT_AMBULATORY_CARE_PROVIDER_SITE_OTHER): Payer: BLUE CROSS/BLUE SHIELD | Admitting: Cardiology

## 2017-08-17 DIAGNOSIS — R002 Palpitations: Secondary | ICD-10-CM

## 2017-08-17 DIAGNOSIS — E039 Hypothyroidism, unspecified: Secondary | ICD-10-CM

## 2017-08-17 NOTE — Assessment & Plan Note (Signed)
By her description it sounds like she was having PSVT

## 2017-08-17 NOTE — Progress Notes (Signed)
08/17/2017 Erica Zuniga   January 08, 1971  [id number]  Primary Physician Constance GoltzSchoenhoff, Harrington Challengereborah D, MD Primary Cardiologist: Dr Duke Salviaandolph  HPI:  Pleasant 47 y/o female just seen by Dr Duke Salviaandolph 4 weeks ago for palpitations. The pt describes intermittent episodes of sudden onset tachycardia. Her HR may go to 120. She has an Apple watch with a HR monitor on it and she has recorded a HR of 120. The tracing is difficult to read but it appears to be an SVT. Dr Duke Salviaandolph ordered labs including a TSH. These were normal Her symptoms have quieted down since her LOV. She thinks she has only had one episode in the past month. These episodes don't last long but make her uncomfortable. She denies any nausea or diaphoresis but admits to chest 'tightness" when she has the tachycardia.    Current Outpatient Medications  Medication Sig Dispense Refill  . ARMOUR THYROID 60 MG tablet   3  . escitalopram (LEXAPRO) 10 MG tablet escitalopram 10 mg tablet  TAKE ONE TABLET BY MOUTH DAILY    . [name] 3-0.02 MG tablet   4   No current facility-administered medications for this visit.     No Known Allergies  Past Medical History:  Diagnosis Date  . Abnormal Pap smear 47 yo  . Family history of brain cancer   . Family history of breast cancer   . Family history of stomach cancer   . Hypothyroidism 07/13/2017  . No pertinent past medical history   . Palpitations 07/13/2017  . Postpartum care following vaginal delivery (01/13/11) 01/13/2011  . Thrombocytopenia due to blood loss 01/14/2011  . Vacuum extraction, delivered, current hospitalization (11/16) 01/13/2011    Social History   Socioeconomic History  . Marital status: Married    Spouse name: Erica Zuniga  . Number of children: 3  . Years of education: Not on file  . Highest education level: Not on file  Occupational History  . Not on file  Social Needs  . Financial resource strain: Not on file  . Food insecurity:    Worry: Not on file    Inability: Not on  file  . Transportation needs:    Medical: Not on file    Non-medical: Not on file  Tobacco Use  . Smoking status: Never Smoker  . Smokeless tobacco: Never Used  Substance and Sexual Activity  . Alcohol use: Yes    Comment: occasional  . Drug use: No  . Sexual activity: Yes  Lifestyle  . Physical activity:    Days per week: Not on file    Minutes per session: Not on file  . Stress: Not on file  Relationships  . Social connections:    Talks on phone: Not on file    Gets together: Not on file    Attends religious service: Not on file    Active member of club or organization: Not on file    Attends meetings of clubs or organizations: Not on file    Relationship status: Not on file  . Intimate partner violence:    Fear of current or ex partner: Not on file    Emotionally abused: Not on file    Physically abused: Not on file    Forced sexual activity: Not on file  Other Topics Concern  . Not on file  Social History Narrative  . Not on file     Family History  Problem Relation Age of Onset  . Breast cancer Mother 560  . Brain  cancer Father 54  . Breast cancer Maternal Aunt 41  . Stomach cancer Maternal Uncle 25  . Cancer Paternal Uncle 3       tumors in his heart  . Breast cancer Maternal Grandmother 60  . Breast cancer Maternal Aunt 60  . Breast cancer Maternal Aunt 70     Review of Systems: General: negative for chills, fever, night sweats or weight changes.  Cardiovascular: negative for chest pain, dyspnea on exertion, edema, orthopnea, palpitations, paroxysmal nocturnal dyspnea or shortness of breath Dermatological: negative for rash Respiratory: negative for cough or wheezing Urologic: negative for hematuria Abdominal: negative for nausea, vomiting, diarrhea, bright red blood per rectum, melena, or hematemesis Neurologic: negative for visual changes, syncope, or dizziness All other systems reviewed and are otherwise negative except as noted above.    Blood  pressure 103/70, pulse 74, height 5\' 4"  (1.626 m), weight 130 lb 12.8 oz (59.3 kg), unknown if currently breastfeeding.  General appearance: alert, cooperative and no distress Extremities: extremities normal, atraumatic, no cyanosis or edema Skin: Skin color, texture, turgor normal. No rashes or lesions Neurologic: Grossly normal   ASSESSMENT AND PLAN:   Palpitations By her description it sounds like she was having PSVT  Hypothyroidism TSH WNL   PLAN  For now I suggested avoiding caffeine and OTC decongestants. I told her I thought she was probably having PSVT. I described vagal maneuver to her to see if she could break the episode on her own. Currently these episodes seem to have abated. I suggested she call if she starts to have more frequent episodes and we can obtain a 30 day event (without making her come to the office first), or if she is in the area she can come in for an EKG. F/U DR Duke Salvia in 6 months.   Corine Shelter PA-C 08/17/2017 8:16 AM

## 2017-08-17 NOTE — Patient Instructions (Signed)
Medication Instructions: Your physician recommends that you continue on your current medications as directed. Please refer to the Current Medication list given to you today.  If you need a refill on your cardiac medications before your next appointment, please call your pharmacy.   Labwork: None  Procedures/Testing: None  Follow-Up: Your physician wants you to follow-up in 6 months with Dr. Fara Chuteandolph You will receive a reminder letter in the mail two months in advance. If you don't receive a letter, please call our office at [phone] to schedule this follow-up appointment.   Special Instructions: Please call the office if you have recurrent palpitations at [phone]. Please avoid decongestants and caffeine.    [facility] you [facility]!!

## 2017-08-17 NOTE — Assessment & Plan Note (Signed)
TSH WNL

## 2017-08-21 ENCOUNTER — Other Ambulatory Visit: Payer: Self-pay | Admitting: Internal Medicine

## 2017-08-21 DIAGNOSIS — R002 Palpitations: Secondary | ICD-10-CM | POA: Diagnosis not present

## 2017-08-21 DIAGNOSIS — Z9189 Other specified personal risk factors, not elsewhere classified: Secondary | ICD-10-CM

## 2017-08-21 DIAGNOSIS — Z87448 Personal history of other diseases of urinary system: Secondary | ICD-10-CM | POA: Diagnosis not present

## 2017-08-29 ENCOUNTER — Ambulatory Visit
Admission: RE | Admit: 2017-08-29 | Discharge: 2017-08-29 | Disposition: A | Discharge status: Home / Self Care | Payer: BLUE CROSS/BLUE SHIELD | Source: Ambulatory Visit | Attending: Internal Medicine | Admitting: Internal Medicine

## 2017-08-29 DIAGNOSIS — F3281 Premenstrual dysphoric disorder: Secondary | ICD-10-CM | POA: Diagnosis not present

## 2017-08-29 DIAGNOSIS — Z9189 Other specified personal risk factors, not elsewhere classified: Secondary | ICD-10-CM

## 2017-08-29 DIAGNOSIS — F439 Reaction to severe stress, unspecified: Secondary | ICD-10-CM | POA: Diagnosis not present

## 2017-08-29 DIAGNOSIS — Z803 Family history of malignant neoplasm of breast: Secondary | ICD-10-CM | POA: Diagnosis not present

## 2017-08-29 MED ORDER — GADOBENATE DIMEGLUMINE 529 MG/ML IV SOLN
11.0000 mL | Freq: Once | INTRAVENOUS | Status: AC | PRN
  Administered 2017-08-29: 11 mL via INTRAVENOUS

## 2017-09-05 DIAGNOSIS — F3281 Premenstrual dysphoric disorder: Secondary | ICD-10-CM | POA: Diagnosis not present

## 2017-09-05 DIAGNOSIS — F439 Reaction to severe stress, unspecified: Secondary | ICD-10-CM | POA: Diagnosis not present

## 2017-09-12 DIAGNOSIS — F439 Reaction to severe stress, unspecified: Secondary | ICD-10-CM | POA: Diagnosis not present

## 2017-09-12 DIAGNOSIS — F3281 Premenstrual dysphoric disorder: Secondary | ICD-10-CM | POA: Diagnosis not present

## 2017-10-09 DIAGNOSIS — F439 Reaction to severe stress, unspecified: Secondary | ICD-10-CM | POA: Diagnosis not present

## 2017-10-09 DIAGNOSIS — F3281 Premenstrual dysphoric disorder: Secondary | ICD-10-CM | POA: Diagnosis not present

## 2017-10-25 DIAGNOSIS — R5383 Other fatigue: Secondary | ICD-10-CM | POA: Diagnosis not present

## 2017-10-25 DIAGNOSIS — E559 Vitamin D deficiency, unspecified: Secondary | ICD-10-CM | POA: Diagnosis not present

## 2017-10-25 DIAGNOSIS — F3281 Premenstrual dysphoric disorder: Secondary | ICD-10-CM | POA: Diagnosis not present

## 2017-10-25 DIAGNOSIS — E039 Hypothyroidism, unspecified: Secondary | ICD-10-CM | POA: Diagnosis not present

## 2017-10-25 DIAGNOSIS — F419 Anxiety disorder, unspecified: Secondary | ICD-10-CM | POA: Diagnosis not present

## 2017-10-31 DIAGNOSIS — F439 Reaction to severe stress, unspecified: Secondary | ICD-10-CM | POA: Diagnosis not present

## 2017-10-31 DIAGNOSIS — F3281 Premenstrual dysphoric disorder: Secondary | ICD-10-CM | POA: Diagnosis not present

## 2017-11-05 DIAGNOSIS — F3281 Premenstrual dysphoric disorder: Secondary | ICD-10-CM | POA: Diagnosis not present

## 2017-11-05 DIAGNOSIS — F439 Reaction to severe stress, unspecified: Secondary | ICD-10-CM | POA: Diagnosis not present

## 2017-11-14 DIAGNOSIS — F3281 Premenstrual dysphoric disorder: Secondary | ICD-10-CM | POA: Diagnosis not present

## 2017-11-14 DIAGNOSIS — F439 Reaction to severe stress, unspecified: Secondary | ICD-10-CM | POA: Diagnosis not present

## 2017-11-19 DIAGNOSIS — F439 Reaction to severe stress, unspecified: Secondary | ICD-10-CM | POA: Diagnosis not present

## 2017-11-19 DIAGNOSIS — F3281 Premenstrual dysphoric disorder: Secondary | ICD-10-CM | POA: Diagnosis not present

## 2017-11-28 DIAGNOSIS — F439 Reaction to severe stress, unspecified: Secondary | ICD-10-CM | POA: Diagnosis not present

## 2017-11-28 DIAGNOSIS — F3281 Premenstrual dysphoric disorder: Secondary | ICD-10-CM | POA: Diagnosis not present

## 2017-12-05 DIAGNOSIS — F439 Reaction to severe stress, unspecified: Secondary | ICD-10-CM | POA: Diagnosis not present

## 2017-12-05 DIAGNOSIS — F3281 Premenstrual dysphoric disorder: Secondary | ICD-10-CM | POA: Diagnosis not present

## 2017-12-07 DIAGNOSIS — H5231 Anisometropia: Secondary | ICD-10-CM | POA: Diagnosis not present

## 2017-12-07 DIAGNOSIS — H04123 Dry eye syndrome of bilateral lacrimal glands: Secondary | ICD-10-CM | POA: Diagnosis not present

## 2017-12-07 DIAGNOSIS — H5213 Myopia, bilateral: Secondary | ICD-10-CM | POA: Diagnosis not present

## 2017-12-12 DIAGNOSIS — F3281 Premenstrual dysphoric disorder: Secondary | ICD-10-CM | POA: Diagnosis not present

## 2017-12-12 DIAGNOSIS — F439 Reaction to severe stress, unspecified: Secondary | ICD-10-CM | POA: Diagnosis not present

## 2017-12-17 DIAGNOSIS — F439 Reaction to severe stress, unspecified: Secondary | ICD-10-CM | POA: Diagnosis not present

## 2017-12-17 DIAGNOSIS — F3281 Premenstrual dysphoric disorder: Secondary | ICD-10-CM | POA: Diagnosis not present

## 2017-12-27 DIAGNOSIS — F3281 Premenstrual dysphoric disorder: Secondary | ICD-10-CM | POA: Diagnosis not present

## 2017-12-27 DIAGNOSIS — F439 Reaction to severe stress, unspecified: Secondary | ICD-10-CM | POA: Diagnosis not present

## 2018-01-03 DIAGNOSIS — F439 Reaction to severe stress, unspecified: Secondary | ICD-10-CM | POA: Diagnosis not present

## 2018-01-03 DIAGNOSIS — F3281 Premenstrual dysphoric disorder: Secondary | ICD-10-CM | POA: Diagnosis not present

## 2018-01-10 DIAGNOSIS — F439 Reaction to severe stress, unspecified: Secondary | ICD-10-CM | POA: Diagnosis not present

## 2018-01-10 DIAGNOSIS — F3281 Premenstrual dysphoric disorder: Secondary | ICD-10-CM | POA: Diagnosis not present

## 2018-01-21 DIAGNOSIS — F3281 Premenstrual dysphoric disorder: Secondary | ICD-10-CM | POA: Diagnosis not present

## 2018-01-21 DIAGNOSIS — F439 Reaction to severe stress, unspecified: Secondary | ICD-10-CM | POA: Diagnosis not present

## 2018-01-30 DIAGNOSIS — F439 Reaction to severe stress, unspecified: Secondary | ICD-10-CM | POA: Diagnosis not present

## 2018-01-30 DIAGNOSIS — F3281 Premenstrual dysphoric disorder: Secondary | ICD-10-CM | POA: Diagnosis not present

## 2018-02-07 DIAGNOSIS — F439 Reaction to severe stress, unspecified: Secondary | ICD-10-CM | POA: Diagnosis not present

## 2018-02-07 DIAGNOSIS — F3281 Premenstrual dysphoric disorder: Secondary | ICD-10-CM | POA: Diagnosis not present

## 2018-02-28 DIAGNOSIS — F3281 Premenstrual dysphoric disorder: Secondary | ICD-10-CM | POA: Diagnosis not present

## 2018-02-28 DIAGNOSIS — F439 Reaction to severe stress, unspecified: Secondary | ICD-10-CM | POA: Diagnosis not present

## 2018-02-28 DIAGNOSIS — L309 Dermatitis, unspecified: Secondary | ICD-10-CM | POA: Diagnosis not present

## 2018-03-21 DIAGNOSIS — F3281 Premenstrual dysphoric disorder: Secondary | ICD-10-CM | POA: Diagnosis not present

## 2018-03-21 DIAGNOSIS — F439 Reaction to severe stress, unspecified: Secondary | ICD-10-CM | POA: Diagnosis not present

## 2018-03-28 DIAGNOSIS — F3281 Premenstrual dysphoric disorder: Secondary | ICD-10-CM | POA: Diagnosis not present

## 2018-03-28 DIAGNOSIS — F439 Reaction to severe stress, unspecified: Secondary | ICD-10-CM | POA: Diagnosis not present

## 2018-04-04 DIAGNOSIS — F3281 Premenstrual dysphoric disorder: Secondary | ICD-10-CM | POA: Diagnosis not present

## 2018-04-04 DIAGNOSIS — F439 Reaction to severe stress, unspecified: Secondary | ICD-10-CM | POA: Diagnosis not present

## 2018-04-11 DIAGNOSIS — F439 Reaction to severe stress, unspecified: Secondary | ICD-10-CM | POA: Diagnosis not present

## 2018-04-11 DIAGNOSIS — F3281 Premenstrual dysphoric disorder: Secondary | ICD-10-CM | POA: Diagnosis not present

## 2018-04-11 DIAGNOSIS — E559 Vitamin D deficiency, unspecified: Secondary | ICD-10-CM | POA: Diagnosis not present

## 2018-04-11 DIAGNOSIS — R454 Irritability and anger: Secondary | ICD-10-CM | POA: Diagnosis not present

## 2018-04-11 DIAGNOSIS — F419 Anxiety disorder, unspecified: Secondary | ICD-10-CM | POA: Diagnosis not present

## 2018-04-11 DIAGNOSIS — R5383 Other fatigue: Secondary | ICD-10-CM | POA: Diagnosis not present

## 2018-04-16 DIAGNOSIS — F3281 Premenstrual dysphoric disorder: Secondary | ICD-10-CM | POA: Diagnosis not present

## 2018-04-16 DIAGNOSIS — F419 Anxiety disorder, unspecified: Secondary | ICD-10-CM | POA: Diagnosis not present

## 2018-04-16 DIAGNOSIS — E039 Hypothyroidism, unspecified: Secondary | ICD-10-CM | POA: Diagnosis not present

## 2018-04-16 DIAGNOSIS — R5383 Other fatigue: Secondary | ICD-10-CM | POA: Diagnosis not present

## 2018-04-18 DIAGNOSIS — F439 Reaction to severe stress, unspecified: Secondary | ICD-10-CM | POA: Diagnosis not present

## 2018-04-18 DIAGNOSIS — F3281 Premenstrual dysphoric disorder: Secondary | ICD-10-CM | POA: Diagnosis not present

## 2018-04-24 DIAGNOSIS — R87610 Atypical squamous cells of undetermined significance on cytologic smear of cervix (ASC-US): Secondary | ICD-10-CM | POA: Diagnosis not present

## 2018-04-24 DIAGNOSIS — Z803 Family history of malignant neoplasm of breast: Secondary | ICD-10-CM | POA: Diagnosis not present

## 2018-04-24 DIAGNOSIS — Z0000 Encounter for general adult medical examination without abnormal findings: Secondary | ICD-10-CM | POA: Diagnosis not present

## 2018-04-24 DIAGNOSIS — R002 Palpitations: Secondary | ICD-10-CM | POA: Diagnosis not present

## 2018-04-25 DIAGNOSIS — F3281 Premenstrual dysphoric disorder: Secondary | ICD-10-CM | POA: Diagnosis not present

## 2018-04-25 DIAGNOSIS — F439 Reaction to severe stress, unspecified: Secondary | ICD-10-CM | POA: Diagnosis not present

## 2018-05-02 DIAGNOSIS — F3281 Premenstrual dysphoric disorder: Secondary | ICD-10-CM | POA: Diagnosis not present

## 2018-05-02 DIAGNOSIS — F439 Reaction to severe stress, unspecified: Secondary | ICD-10-CM | POA: Diagnosis not present

## 2018-05-09 DIAGNOSIS — F3281 Premenstrual dysphoric disorder: Secondary | ICD-10-CM | POA: Diagnosis not present

## 2018-05-09 DIAGNOSIS — F439 Reaction to severe stress, unspecified: Secondary | ICD-10-CM | POA: Diagnosis not present

## 2018-05-31 DIAGNOSIS — F439 Reaction to severe stress, unspecified: Secondary | ICD-10-CM | POA: Diagnosis not present

## 2018-05-31 DIAGNOSIS — F3281 Premenstrual dysphoric disorder: Secondary | ICD-10-CM | POA: Diagnosis not present

## 2018-06-04 ENCOUNTER — Telehealth: Payer: Self-pay | Admitting: *Deleted

## 2018-06-04 NOTE — Telephone Encounter (Signed)
Patient called from recall list, past due for follow up visit with Dr [place] Called to try to arrange virtual visit. Left message to call back   

## 2018-06-10 DIAGNOSIS — F3281 Premenstrual dysphoric disorder: Secondary | ICD-10-CM | POA: Diagnosis not present

## 2018-06-10 DIAGNOSIS — F439 Reaction to severe stress, unspecified: Secondary | ICD-10-CM | POA: Diagnosis not present

## 2018-06-24 DIAGNOSIS — F439 Reaction to severe stress, unspecified: Secondary | ICD-10-CM | POA: Diagnosis not present

## 2018-06-24 DIAGNOSIS — F3281 Premenstrual dysphoric disorder: Secondary | ICD-10-CM | POA: Diagnosis not present

## 2018-07-02 DIAGNOSIS — F3281 Premenstrual dysphoric disorder: Secondary | ICD-10-CM | POA: Diagnosis not present

## 2018-07-02 DIAGNOSIS — F439 Reaction to severe stress, unspecified: Secondary | ICD-10-CM | POA: Diagnosis not present

## 2018-07-09 DIAGNOSIS — F439 Reaction to severe stress, unspecified: Secondary | ICD-10-CM | POA: Diagnosis not present

## 2018-07-09 DIAGNOSIS — F3281 Premenstrual dysphoric disorder: Secondary | ICD-10-CM | POA: Diagnosis not present

## 2018-07-16 DIAGNOSIS — R5383 Other fatigue: Secondary | ICD-10-CM | POA: Diagnosis not present

## 2018-07-16 DIAGNOSIS — F3281 Premenstrual dysphoric disorder: Secondary | ICD-10-CM | POA: Diagnosis not present

## 2018-07-16 DIAGNOSIS — E039 Hypothyroidism, unspecified: Secondary | ICD-10-CM | POA: Diagnosis not present

## 2018-07-16 DIAGNOSIS — E559 Vitamin D deficiency, unspecified: Secondary | ICD-10-CM | POA: Diagnosis not present

## 2018-07-16 DIAGNOSIS — F419 Anxiety disorder, unspecified: Secondary | ICD-10-CM | POA: Diagnosis not present

## 2018-07-18 DIAGNOSIS — F3281 Premenstrual dysphoric disorder: Secondary | ICD-10-CM | POA: Diagnosis not present

## 2018-07-18 DIAGNOSIS — F439 Reaction to severe stress, unspecified: Secondary | ICD-10-CM | POA: Diagnosis not present

## 2018-07-23 DIAGNOSIS — F3281 Premenstrual dysphoric disorder: Secondary | ICD-10-CM | POA: Diagnosis not present

## 2018-07-23 DIAGNOSIS — F439 Reaction to severe stress, unspecified: Secondary | ICD-10-CM | POA: Diagnosis not present

## 2018-07-29 DIAGNOSIS — F3281 Premenstrual dysphoric disorder: Secondary | ICD-10-CM | POA: Diagnosis not present

## 2018-07-29 DIAGNOSIS — F439 Reaction to severe stress, unspecified: Secondary | ICD-10-CM | POA: Diagnosis not present

## 2018-09-12 DIAGNOSIS — F3281 Premenstrual dysphoric disorder: Secondary | ICD-10-CM | POA: Diagnosis not present

## 2018-09-12 DIAGNOSIS — F439 Reaction to severe stress, unspecified: Secondary | ICD-10-CM | POA: Diagnosis not present

## 2018-09-19 DIAGNOSIS — F3281 Premenstrual dysphoric disorder: Secondary | ICD-10-CM | POA: Diagnosis not present

## 2018-09-19 DIAGNOSIS — F439 Reaction to severe stress, unspecified: Secondary | ICD-10-CM | POA: Diagnosis not present

## 2018-09-26 DIAGNOSIS — F3281 Premenstrual dysphoric disorder: Secondary | ICD-10-CM | POA: Diagnosis not present

## 2018-09-26 DIAGNOSIS — F439 Reaction to severe stress, unspecified: Secondary | ICD-10-CM | POA: Diagnosis not present

## 2018-10-01 DIAGNOSIS — F3281 Premenstrual dysphoric disorder: Secondary | ICD-10-CM | POA: Diagnosis not present

## 2018-10-01 DIAGNOSIS — F439 Reaction to severe stress, unspecified: Secondary | ICD-10-CM | POA: Diagnosis not present

## 2018-10-24 DIAGNOSIS — F3281 Premenstrual dysphoric disorder: Secondary | ICD-10-CM | POA: Diagnosis not present

## 2018-10-24 DIAGNOSIS — F439 Reaction to severe stress, unspecified: Secondary | ICD-10-CM | POA: Diagnosis not present

## 2018-10-28 DIAGNOSIS — F439 Reaction to severe stress, unspecified: Secondary | ICD-10-CM | POA: Diagnosis not present

## 2018-10-28 DIAGNOSIS — F3281 Premenstrual dysphoric disorder: Secondary | ICD-10-CM | POA: Diagnosis not present

## 2018-10-31 ENCOUNTER — Ambulatory Visit: Payer: BLUE CROSS/BLUE SHIELD | Admitting: Cardiovascular Disease

## 2018-12-10 DIAGNOSIS — H5213 Myopia, bilateral: Secondary | ICD-10-CM | POA: Diagnosis not present

## 2018-12-10 DIAGNOSIS — H524 Presbyopia: Secondary | ICD-10-CM | POA: Diagnosis not present

## 2018-12-10 DIAGNOSIS — H5231 Anisometropia: Secondary | ICD-10-CM | POA: Diagnosis not present

## 2018-12-10 DIAGNOSIS — H16002 Unspecified corneal ulcer, left eye: Secondary | ICD-10-CM | POA: Diagnosis not present

## 2018-12-10 DIAGNOSIS — H04123 Dry eye syndrome of bilateral lacrimal glands: Secondary | ICD-10-CM | POA: Diagnosis not present

## 2018-12-26 DIAGNOSIS — L0232 Furuncle of buttock: Secondary | ICD-10-CM | POA: Diagnosis not present

## 2018-12-26 DIAGNOSIS — L2084 Intrinsic (allergic) eczema: Secondary | ICD-10-CM | POA: Diagnosis not present

## 2019-01-16 DIAGNOSIS — F3281 Premenstrual dysphoric disorder: Secondary | ICD-10-CM | POA: Diagnosis not present

## 2019-01-16 DIAGNOSIS — E559 Vitamin D deficiency, unspecified: Secondary | ICD-10-CM | POA: Diagnosis not present

## 2019-01-16 DIAGNOSIS — R5383 Other fatigue: Secondary | ICD-10-CM | POA: Diagnosis not present

## 2019-01-16 DIAGNOSIS — F419 Anxiety disorder, unspecified: Secondary | ICD-10-CM | POA: Diagnosis not present

## 2019-02-18 DIAGNOSIS — L2089 Other atopic dermatitis: Secondary | ICD-10-CM | POA: Diagnosis not present

## 2019-06-27 ENCOUNTER — Encounter: Payer: Self-pay | Admitting: Cardiology

## 2019-06-27 ENCOUNTER — Telehealth (INDEPENDENT_AMBULATORY_CARE_PROVIDER_SITE_OTHER): Payer: BC Managed Care – PPO | Admitting: Cardiology

## 2019-06-27 VITALS — Ht 64.0 in | Wt 135.0 lb

## 2019-06-27 DIAGNOSIS — R002 Palpitations: Secondary | ICD-10-CM

## 2019-06-27 DIAGNOSIS — E039 Hypothyroidism, unspecified: Secondary | ICD-10-CM | POA: Diagnosis not present

## 2019-06-27 NOTE — Patient Instructions (Signed)
Medication Instructions:  Your physician recommends that you continue on your current medications as directed. Please refer to the Current Medication list given to you today.  *If you need a refill on your cardiac medications before your next appointment, please call your pharmacy*  Follow-Up: At [facility], you and your health needs are our priority.  As part of our continuing mission to provide you with exceptional heart care, we have created designated Provider Care Teams.  These Care Teams include your primary Cardiologist (physician) and Advanced Practice Providers (APPs -  Physician Assistants and Nurse Practitioners) who all work together to provide you with the care you need, when you need it.  We recommend signing up for the patient portal called "MyChart".  Sign up information is provided on this After Visit Summary.  MyChart is used to connect with patients for Virtual Visits (Telemedicine).  Patients are able to view lab/test results, encounter notes, upcoming appointments, etc.  Non-urgent messages can be sent to your provider as well.   To learn more about what you can do with MyChart, go to [email or web address].    Your next appointment:   As needed with Dr. Duke Salvia

## 2019-06-27 NOTE — Progress Notes (Signed)
Virtual Visit via Telephone Note   This visit type was conducted due to national recommendations for restrictions regarding the COVID-19 Pandemic (e.g. social distancing) in an effort to limit this patient's exposure and mitigate transmission in our community.  Due to her co-morbid illnesses, this patient is at least at moderate risk for complications without adequate follow up.  This format is felt to be most appropriate for this patient at this time.  The patient did not have access to video technology/had technical difficulties with video requiring transitioning to audio format only (telephone).  All issues noted in this document were discussed and addressed.  No physical exam could be performed with this format.  Please refer to the patient's chart for her  consent to telehealth for [facility].   The patient was identified using 2 identifiers.  Date:  06/27/2019   ID:  Erica Zuniga, DOB Oct 05, 1970, MRN [id number]  Patient Location: Home Provider Location: Home  PCP:  Lanice Shirts, MD  Cardiologist:  Dr Oval Linsey Electrophysiologist:  None   Evaluation Performed:  Follow-Up Visit  Chief Complaint:  none  History of Present Illness:    Erica Zuniga is a 49 y.o. female with a history of palpitations. She had seen [place] in the past for intermittent sudden tachycardia and was suspected to have PSVT. She was educated on avoiding stimulants and OTC decongestants and her symptoms improved.  She was contacted today for routine follow up.  She has done well since we saw her last.  She has at home with her child doing home schooling since COVID restrictions were implemented.  She has had her vaccine.   The patient does not have symptoms concerning for COVID-19 infection (fever, chills, cough, or new shortness of breath).    Past Medical History:  Diagnosis Date  . Abnormal Pap smear 49 yo  . Family history of brain cancer   . Family history of breast cancer   . Family history  of stomach cancer   . Hypothyroidism 07/13/2017  . No pertinent past medical history   . Palpitations 07/13/2017  . Postpartum care following vaginal delivery (01/13/11) 01/13/2011  . Thrombocytopenia due to blood loss 01/14/2011  . Vacuum extraction, delivered, current hospitalization (11/16) 01/13/2011   Past Surgical History:  Procedure Laterality Date  . NO PAST SURGERIES       Current Meds  Medication Sig  . DENAVIR 1 % cream as needed.   . dupilumab (DUPIXENT) 300 MG/2ML prefilled syringe Inject into the skin.   Marland Kitchen escitalopram (LEXAPRO) 10 MG tablet Take 10 mg by mouth daily.   . famciclovir (FAMVIR) 500 MG tablet Take 500 mg by mouth as needed.   . fexofenadine (ALLEGRA ALLERGY) 180 MG tablet Take 180 mg by mouth as needed.   Marland Kitchen [name] 3-0.02 MG tablet      Allergies:   Patient has no known allergies.   Social History   Tobacco Use  . Smoking status: Never Smoker  . Smokeless tobacco: Never Used  Substance Use Topics  . Alcohol use: Yes    Comment: occasional  . Drug use: No     Family Hx: The patient's family history includes Brain cancer (age of onset: 73) in her father; Breast cancer (age of onset: 85) in her maternal aunt; Breast cancer (age of onset: 5) in her maternal aunt, maternal grandmother, and mother; Breast cancer (age of onset: 20) in her maternal aunt; Cancer (age of onset: 31) in her paternal uncle; Stomach  cancer (age of onset: 4) in her maternal uncle.  ROS:   Please see the history of present illness.    All other systems reviewed and are negative.   Prior CV studies:   The following studies were reviewed today:   Labs/Other Tests and Data Reviewed:    EKG:  An ECG dated 07/13/2017 was personally reviewed today and demonstrated:  NSR-HR 76  Recent Labs: No results found for requested labs within last 8760 hours.   Recent Lipid Panel No results found for: CHOL, TRIG, HDL, CHOLHDL, LDLCALC, LDLDIRECT  Wt Readings from Last 3 Encounters:    06/27/19 135 lb (61.2 kg)  08/17/17 130 lb 12.8 oz (59.3 kg)  07/13/17 131 lb 12.8 oz (59.8 kg)     Objective:    Vital Signs:  Ht 5\' 4"  (1.626 m)   Wt 135 lb (61.2 kg)   BMI 23.17 kg/m    VITAL SIGNS:  reviewed  ASSESSMENT & PLAN:    Palpitations- Suspected PSVT by history but no recurrence.  Hypothyroidism- Followed by PCP  Plan: PRN follow up  COVID-19 Education: The signs and symptoms of COVID-19 were discussed with the patient and how to seek care for testing (follow up with PCP or arrange E-visit).  The importance of social distancing was discussed today.  Time:   Today, I have spent 10 minutes with the patient with telehealth technology discussing the above problems.     Medication Adjustments/Labs and Tests Ordered: Current medicines are reviewed at length with the patient today.  Concerns regarding medicines are outlined above.   Tests Ordered: No orders of the defined types were placed in this encounter.   Medication Changes: No orders of the defined types were placed in this encounter.   Follow Up:  Either In Person or Virtual prn  Signed, , PA-C  06/27/2019 10:49 AM    [facility]

## 2021-01-26 ENCOUNTER — Ambulatory Visit
Admission: RE | Admit: 2021-01-26 | Discharge: 2021-01-26 | Disposition: A | Discharge status: Home / Self Care | Payer: BC Managed Care – PPO | Source: Ambulatory Visit | Attending: Chiropractic Medicine | Admitting: Chiropractic Medicine

## 2021-01-26 ENCOUNTER — Other Ambulatory Visit: Payer: Self-pay

## 2021-01-26 ENCOUNTER — Other Ambulatory Visit: Payer: Self-pay | Admitting: Chiropractic Medicine

## 2021-01-26 DIAGNOSIS — R52 Pain, unspecified: Secondary | ICD-10-CM

## 2022-03-28 ENCOUNTER — Other Ambulatory Visit: Payer: Self-pay

## 2022-03-28 ENCOUNTER — Ambulatory Visit: Payer: BC Managed Care – PPO | Attending: Neurosurgery | Admitting: Physical Therapy

## 2022-03-28 ENCOUNTER — Encounter: Payer: Self-pay | Admitting: Physical Therapy

## 2022-03-28 DIAGNOSIS — R252 Cramp and spasm: Secondary | ICD-10-CM | POA: Diagnosis present

## 2022-03-28 DIAGNOSIS — M5459 Other low back pain: Secondary | ICD-10-CM | POA: Diagnosis present

## 2022-03-28 DIAGNOSIS — M5416 Radiculopathy, lumbar region: Secondary | ICD-10-CM | POA: Diagnosis not present

## 2022-03-28 NOTE — Patient Instructions (Signed)
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
## 2022-04-04 ENCOUNTER — Ambulatory Visit: Payer: BC Managed Care – PPO | Attending: Neurosurgery | Admitting: Physical Therapy

## 2022-04-04 DIAGNOSIS — M5459 Other low back pain: Secondary | ICD-10-CM | POA: Diagnosis present

## 2022-04-04 DIAGNOSIS — M5416 Radiculopathy, lumbar region: Secondary | ICD-10-CM | POA: Diagnosis present

## 2022-04-04 DIAGNOSIS — R252 Cramp and spasm: Secondary | ICD-10-CM | POA: Insufficient documentation
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
## 2022-04-07 ENCOUNTER — Ambulatory Visit: Payer: BC Managed Care – PPO | Admitting: Physical Therapy

## 2022-04-07 DIAGNOSIS — R252 Cramp and spasm: Secondary | ICD-10-CM

## 2022-04-07 DIAGNOSIS — M5416 Radiculopathy, lumbar region: Secondary | ICD-10-CM

## 2022-04-07 DIAGNOSIS — M5459 Other low back pain: Secondary | ICD-10-CM

## 2022-04-12 ENCOUNTER — Encounter: Payer: Self-pay | Admitting: Physical Therapy

## 2022-04-12 ENCOUNTER — Ambulatory Visit: Payer: BC Managed Care – PPO | Admitting: Physical Therapy

## 2022-04-12 DIAGNOSIS — M5459 Other low back pain: Secondary | ICD-10-CM

## 2022-04-12 DIAGNOSIS — M5416 Radiculopathy, lumbar region: Secondary | ICD-10-CM | POA: Diagnosis not present

## 2022-04-12 DIAGNOSIS — R252 Cramp and spasm: Secondary | ICD-10-CM
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
## 2022-04-14 ENCOUNTER — Ambulatory Visit: Payer: BC Managed Care – PPO | Admitting: Physical Therapy

## 2022-04-14 DIAGNOSIS — M5416 Radiculopathy, lumbar region: Secondary | ICD-10-CM | POA: Diagnosis not present

## 2022-04-14 DIAGNOSIS — R252 Cramp and spasm: Secondary | ICD-10-CM

## 2022-04-14 DIAGNOSIS — M5459 Other low back pain: Secondary | ICD-10-CM
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
## 2022-04-18 ENCOUNTER — Encounter: Payer: BC Managed Care – PPO | Admitting: Physical Therapy

## 2022-04-20 ENCOUNTER — Ambulatory Visit: Payer: BC Managed Care – PPO | Admitting: Physical Therapy

## 2022-04-20 DIAGNOSIS — M5416 Radiculopathy, lumbar region: Secondary | ICD-10-CM | POA: Diagnosis not present

## 2022-04-20 DIAGNOSIS — R252 Cramp and spasm: Secondary | ICD-10-CM

## 2022-04-20 DIAGNOSIS — M5459 Other low back pain: Secondary | ICD-10-CM
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
## 2022-04-25 ENCOUNTER — Ambulatory Visit: Payer: BC Managed Care – PPO | Admitting: Physical Therapy

## 2022-04-25 ENCOUNTER — Encounter: Payer: Self-pay | Admitting: Physical Therapy

## 2022-04-25 DIAGNOSIS — R252 Cramp and spasm: Secondary | ICD-10-CM

## 2022-04-25 DIAGNOSIS — M5459 Other low back pain: Secondary | ICD-10-CM

## 2022-04-25 DIAGNOSIS — M5416 Radiculopathy, lumbar region: Secondary | ICD-10-CM | POA: Diagnosis not present
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
## 2022-04-28 ENCOUNTER — Ambulatory Visit: Payer: BC Managed Care – PPO | Attending: Neurosurgery | Admitting: Physical Therapy

## 2022-04-28 DIAGNOSIS — R252 Cramp and spasm: Secondary | ICD-10-CM | POA: Insufficient documentation

## 2022-04-28 DIAGNOSIS — M5416 Radiculopathy, lumbar region: Secondary | ICD-10-CM | POA: Insufficient documentation

## 2022-04-28 DIAGNOSIS — M5459 Other low back pain: Secondary | ICD-10-CM | POA: Insufficient documentation
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
## 2022-04-28 NOTE — Patient Instructions (Signed)

## 2022-05-02 ENCOUNTER — Ambulatory Visit: Payer: BC Managed Care – PPO | Admitting: Physical Therapy

## 2022-05-02 DIAGNOSIS — M5416 Radiculopathy, lumbar region: Secondary | ICD-10-CM

## 2022-05-02 DIAGNOSIS — M5459 Other low back pain: Secondary | ICD-10-CM

## 2022-05-02 DIAGNOSIS — R252 Cramp and spasm: Secondary | ICD-10-CM
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
## 2022-05-05 ENCOUNTER — Ambulatory Visit: Payer: BC Managed Care – PPO | Admitting: Physical Therapy

## 2022-05-05 DIAGNOSIS — M5459 Other low back pain: Secondary | ICD-10-CM

## 2022-05-05 DIAGNOSIS — M5416 Radiculopathy, lumbar region: Secondary | ICD-10-CM

## 2022-05-05 DIAGNOSIS — R252 Cramp and spasm: Secondary | ICD-10-CM
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
## 2022-05-09 ENCOUNTER — Ambulatory Visit: Payer: BC Managed Care – PPO | Admitting: Physical Therapy

## 2022-05-12 ENCOUNTER — Ambulatory Visit: Payer: BC Managed Care – PPO | Admitting: Physical Therapy

## 2022-05-12 DIAGNOSIS — M5459 Other low back pain: Secondary | ICD-10-CM

## 2022-05-12 DIAGNOSIS — M5416 Radiculopathy, lumbar region: Secondary | ICD-10-CM

## 2022-05-12 DIAGNOSIS — R252 Cramp and spasm: Secondary | ICD-10-CM
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
## 2022-05-16 ENCOUNTER — Encounter: Payer: BC Managed Care – PPO | Admitting: Physical Therapy

## 2022-05-17 ENCOUNTER — Encounter: Payer: Self-pay | Admitting: Physical Therapy

## 2022-05-17 ENCOUNTER — Ambulatory Visit: Payer: BC Managed Care – PPO | Admitting: Physical Therapy

## 2022-05-17 DIAGNOSIS — M5416 Radiculopathy, lumbar region: Secondary | ICD-10-CM

## 2022-05-17 DIAGNOSIS — R252 Cramp and spasm: Secondary | ICD-10-CM

## 2022-05-17 DIAGNOSIS — M5459 Other low back pain: Secondary | ICD-10-CM
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
## 2022-05-19 ENCOUNTER — Ambulatory Visit: Payer: BC Managed Care – PPO | Admitting: Physical Therapy

## 2022-05-19 DIAGNOSIS — R252 Cramp and spasm: Secondary | ICD-10-CM

## 2022-05-19 DIAGNOSIS — M5459 Other low back pain: Secondary | ICD-10-CM

## 2022-05-19 DIAGNOSIS — M5416 Radiculopathy, lumbar region: Secondary | ICD-10-CM | POA: Diagnosis not present
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
## 2022-05-20 NOTE — Addendum Note (Signed)
Addended by: Alvera Singh on: 05/20/2022 07:44 AM   Modules accepted: Orders

## 2022-05-23 ENCOUNTER — Encounter: Payer: BC Managed Care – PPO | Admitting: Physical Therapy

## 2022-05-30 ENCOUNTER — Ambulatory Visit: Payer: BC Managed Care – PPO | Admitting: Rehabilitative and Restorative Service Providers"

## 2022-06-01 ENCOUNTER — Ambulatory Visit: Payer: BC Managed Care – PPO | Admitting: Rehabilitative and Restorative Service Providers"

## 2022-06-06 ENCOUNTER — Ambulatory Visit: Payer: BC Managed Care – PPO | Attending: Neurosurgery | Admitting: Physical Therapy

## 2022-06-06 ENCOUNTER — Telehealth: Payer: Self-pay | Admitting: Physical Therapy

## 2022-06-06 DIAGNOSIS — R252 Cramp and spasm: Secondary | ICD-10-CM | POA: Insufficient documentation

## 2022-06-06 DIAGNOSIS — M5416 Radiculopathy, lumbar region: Secondary | ICD-10-CM | POA: Insufficient documentation

## 2022-06-06 DIAGNOSIS — M5459 Other low back pain: Secondary | ICD-10-CM | POA: Insufficient documentation

## 2022-06-06 IMAGING — CR DG LUMBAR SPINE COMPLETE 4+V
5 series · 5 of 5 positions shown · non-contrast
Comparison: None.

CLINICAL DATA: Progressive lower back pain

EXAM:
LUMBAR SPINE - COMPLETE 4+ VIEW

[w lumbar spine ap]
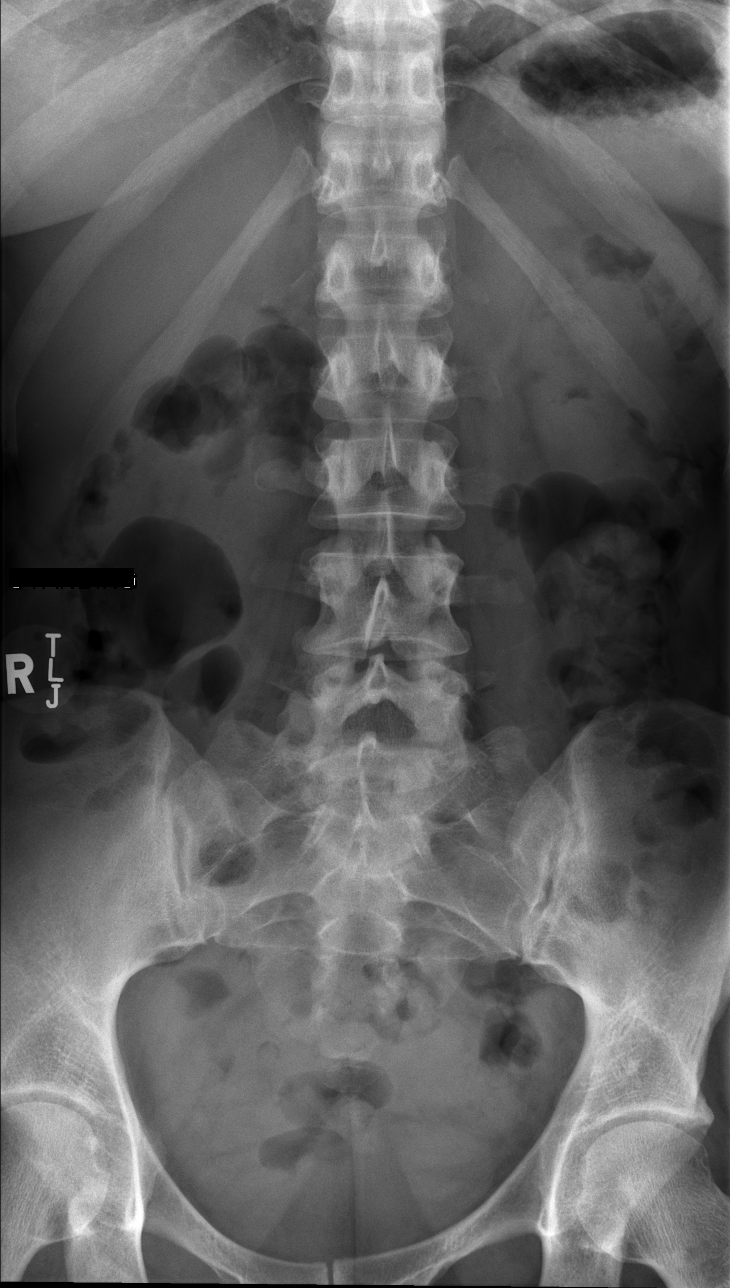

[w lumbar spine obl (1 of 2)]
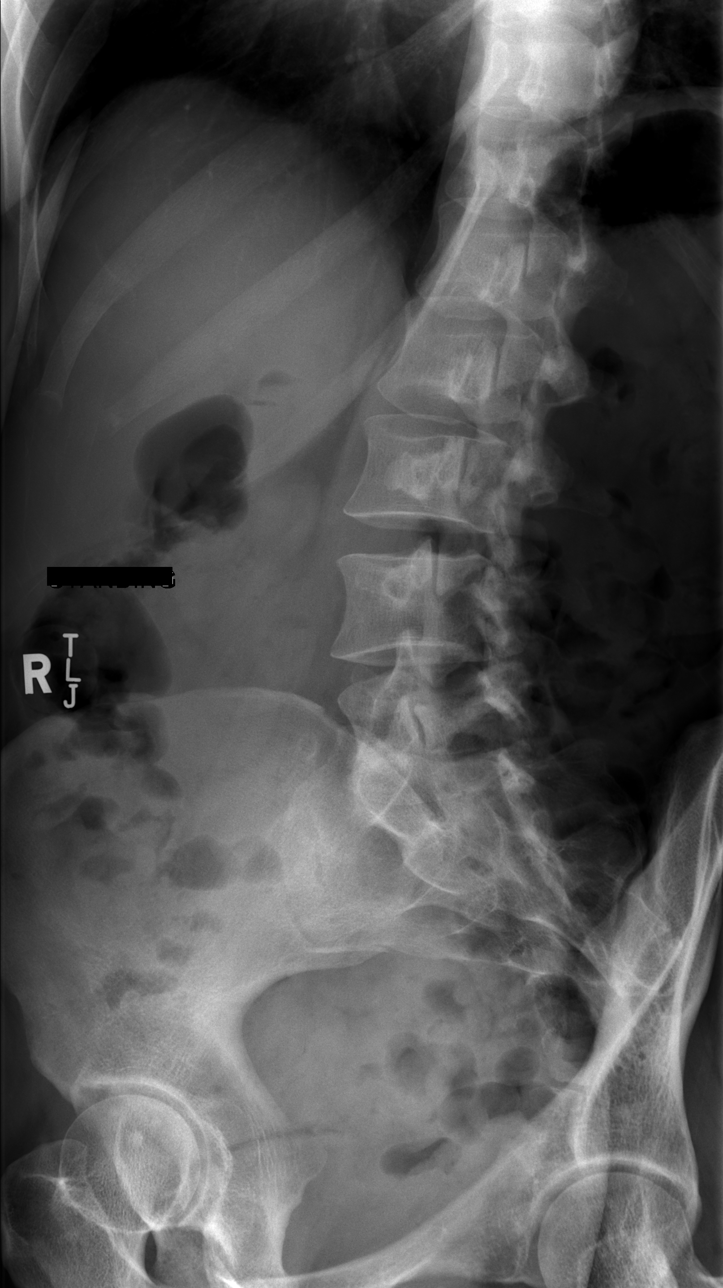

[w lumbar spine obl (2 of 2)]
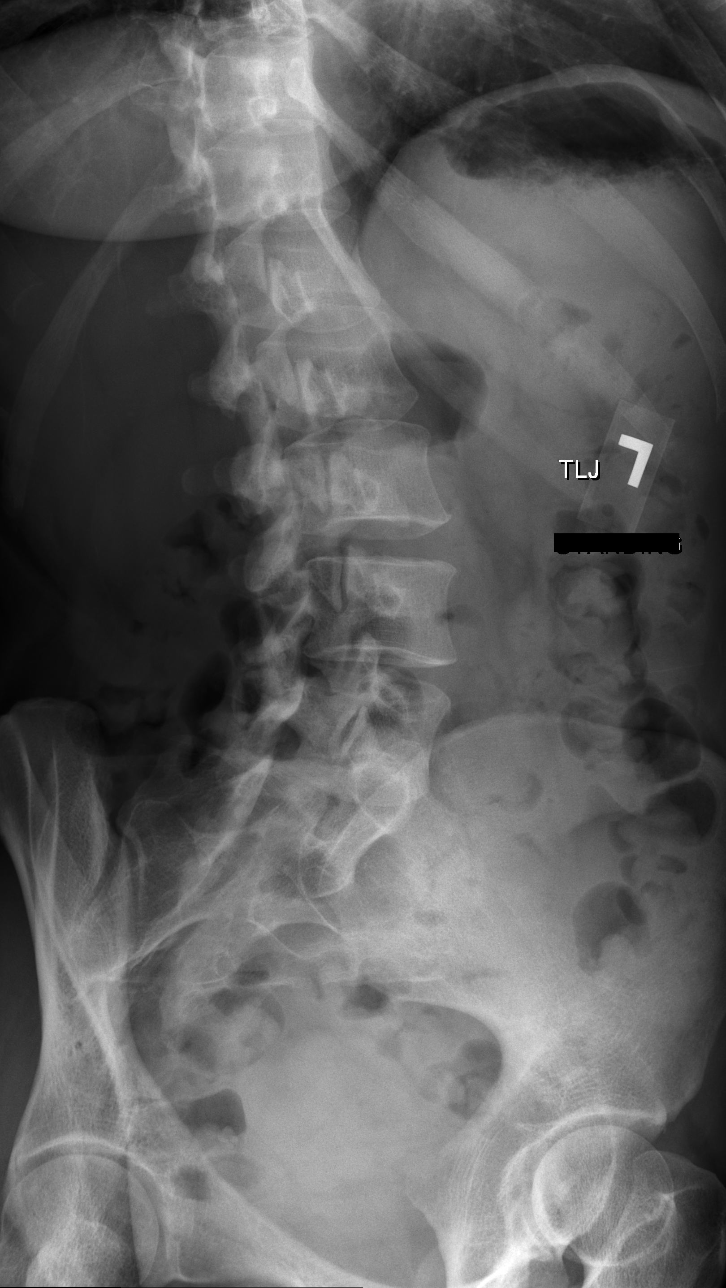

[w lumbar spine lat]
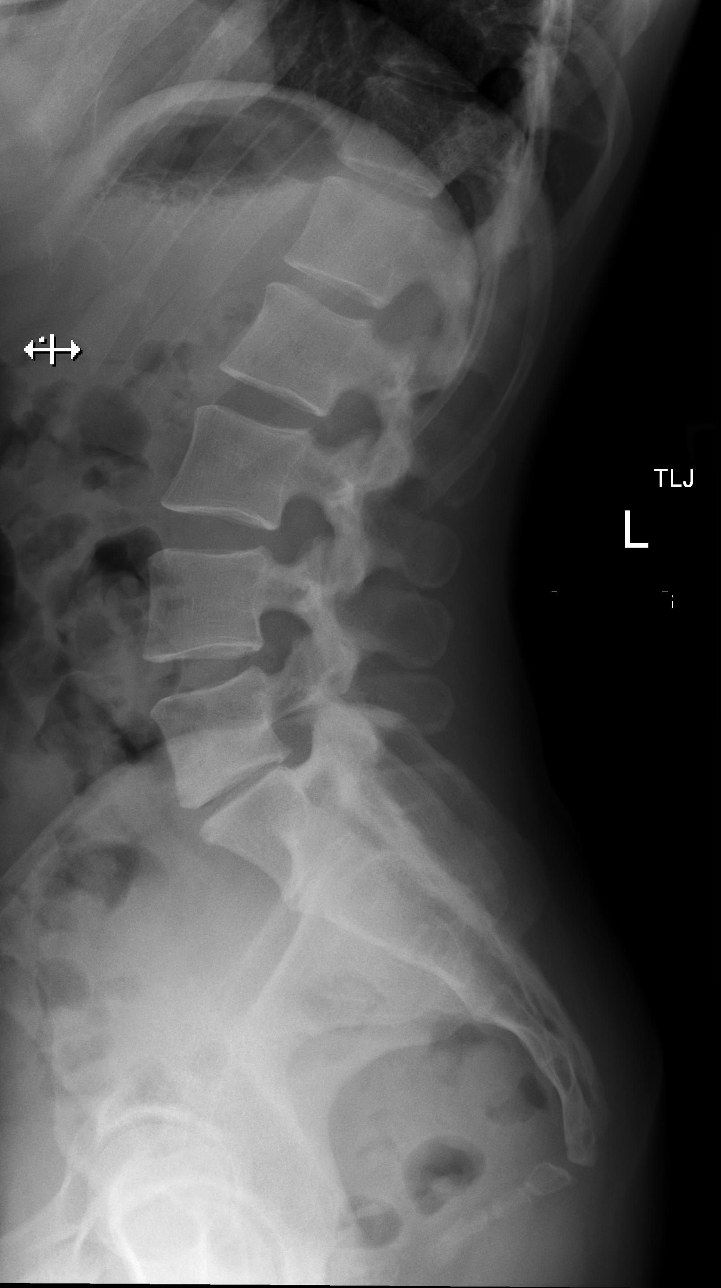

[w lumbar l-5 s-1 spot]
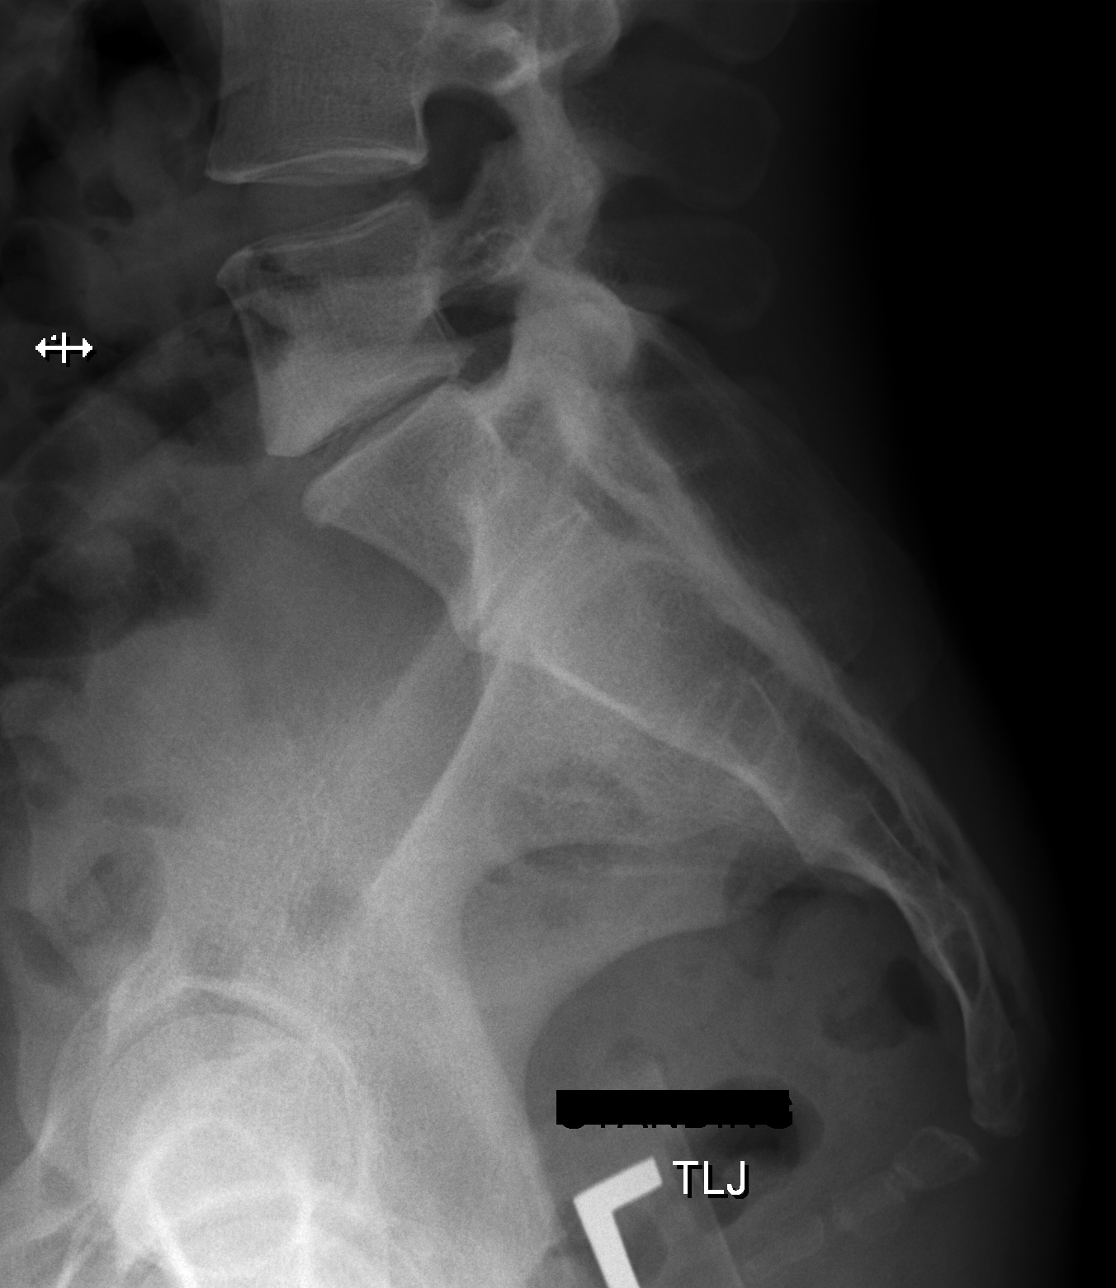

[5 of 5 positions shown; findings below may reference images not displayed]

FINDINGS: Loss of disc space height at L5-S1 consistent with degenerative disc
disease. No evidence of fracture or malalignment. No significant
facet changes. Normal bony mineralization. No lytic or blastic
osseous lesion.
IMPRESSION: Loss of disc space height at L5-S1 consistent with degenerative disc
disease.

## 2022-06-06 NOTE — Telephone Encounter (Signed)
Pt was a no show to PT appointment on 06/06/22 at 8am.  PT left VM requesting call back.  This was Pt's 2nd consecutive no show or cancel within 24 hours.  Rainelle Sulewski, PT 06/06/22 8:18 AM

## 2022-06-08 ENCOUNTER — Ambulatory Visit: Payer: BC Managed Care – PPO | Admitting: Physical Therapy

## 2022-06-08 DIAGNOSIS — M5459 Other low back pain: Secondary | ICD-10-CM

## 2022-06-08 DIAGNOSIS — M5416 Radiculopathy, lumbar region: Secondary | ICD-10-CM

## 2022-06-08 DIAGNOSIS — R252 Cramp and spasm: Secondary | ICD-10-CM
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
## 2022-06-13 ENCOUNTER — Encounter: Payer: Self-pay | Admitting: Physical Therapy

## 2022-06-16 ENCOUNTER — Encounter: Payer: Self-pay | Admitting: Physical Therapy

## 2022-06-20 ENCOUNTER — Encounter: Payer: Self-pay | Admitting: Physical Therapy

## 2022-06-22 ENCOUNTER — Encounter: Payer: Self-pay | Admitting: Physical Therapy

## 2022-06-26 ENCOUNTER — Encounter: Payer: Self-pay | Admitting: Rehabilitative and Restorative Service Providers"

## 2022-06-27 ENCOUNTER — Encounter: Payer: Self-pay | Admitting: Physical Therapy
